# Patient Record
Sex: Male | Born: 1950 | Race: White | Hispanic: No | Marital: Single | Smoking: Never smoker
Health system: Southern US, Community
[De-identification: ages and names within clinical notes are randomized; demographics above are authoritative.]

## PROBLEM LIST (undated history)

## (undated) DIAGNOSIS — S42301A Unspecified fracture of shaft of humerus, right arm, initial encounter for closed fracture: Secondary | ICD-10-CM

## (undated) DIAGNOSIS — H04129 Dry eye syndrome of unspecified lacrimal gland: Secondary | ICD-10-CM

## (undated) DIAGNOSIS — M24529 Contracture, unspecified elbow: Secondary | ICD-10-CM

## (undated) DIAGNOSIS — R1310 Dysphagia, unspecified: Secondary | ICD-10-CM

## (undated) DIAGNOSIS — F32A Depression, unspecified: Secondary | ICD-10-CM

## (undated) DIAGNOSIS — N390 Urinary tract infection, site not specified: Secondary | ICD-10-CM

## (undated) DIAGNOSIS — G8191 Hemiplegia, unspecified affecting right dominant side: Secondary | ICD-10-CM

## (undated) DIAGNOSIS — R52 Pain, unspecified: Secondary | ICD-10-CM

## (undated) DIAGNOSIS — T402X5A Adverse effect of other opioids, initial encounter: Secondary | ICD-10-CM

## (undated) DIAGNOSIS — S069XAA Unspecified intracranial injury with loss of consciousness status unknown, initial encounter: Secondary | ICD-10-CM

## (undated) DIAGNOSIS — R569 Unspecified convulsions: Secondary | ICD-10-CM

## (undated) DIAGNOSIS — F0280 Dementia in other diseases classified elsewhere without behavioral disturbance: Secondary | ICD-10-CM

## (undated) DIAGNOSIS — F419 Anxiety disorder, unspecified: Secondary | ICD-10-CM

## (undated) DIAGNOSIS — F329 Major depressive disorder, single episode, unspecified: Secondary | ICD-10-CM

## (undated) DIAGNOSIS — E785 Hyperlipidemia, unspecified: Secondary | ICD-10-CM

## (undated) DIAGNOSIS — S069X9A Unspecified intracranial injury with loss of consciousness of unspecified duration, initial encounter: Secondary | ICD-10-CM

## (undated) DIAGNOSIS — K5900 Constipation, unspecified: Secondary | ICD-10-CM

## (undated) DIAGNOSIS — K5903 Drug induced constipation: Secondary | ICD-10-CM

## (undated) DIAGNOSIS — E46 Unspecified protein-calorie malnutrition: Secondary | ICD-10-CM

## (undated) DIAGNOSIS — F0150 Vascular dementia without behavioral disturbance: Secondary | ICD-10-CM

## (undated) HISTORY — DX: Dysphagia, unspecified: R13.10

## (undated) HISTORY — DX: Anxiety disorder, unspecified: F41.9

## (undated) HISTORY — DX: Urinary tract infection, site not specified: N39.0

## (undated) HISTORY — DX: Hyperlipidemia, unspecified: E78.5

## (undated) HISTORY — DX: Unspecified convulsions: R56.9

## (undated) HISTORY — DX: Pain, unspecified: R52

## (undated) HISTORY — DX: Adverse effect of other opioids, initial encounter: T40.2X5A

## (undated) HISTORY — DX: Major depressive disorder, single episode, unspecified: F32.9

## (undated) HISTORY — DX: Unspecified intracranial injury with loss of consciousness status unknown, initial encounter: S06.9XAA

## (undated) HISTORY — DX: Dementia in other diseases classified elsewhere, unspecified severity, without behavioral disturbance, psychotic disturbance, mood disturbance, and anxiety: F02.80

## (undated) HISTORY — DX: Adverse effect of other opioids, initial encounter: K59.03

## (undated) HISTORY — DX: Unspecified fracture of shaft of humerus, right arm, initial encounter for closed fracture: S42.301A

## (undated) HISTORY — DX: Unspecified protein-calorie malnutrition: E46

## (undated) HISTORY — DX: Unspecified intracranial injury with loss of consciousness of unspecified duration, initial encounter: S06.9X9A

## (undated) HISTORY — DX: Depression, unspecified: F32.A

---

## 1974-09-11 HISTORY — PX: OTHER SURGICAL HISTORY: SHX169

## 1987-06-08 HISTORY — PX: OTHER SURGICAL HISTORY: SHX169

## 1987-08-09 HISTORY — PX: NEPHRECTOMY: SHX65

## 1998-03-17 ENCOUNTER — Encounter: Admission: RE | Admit: 1998-03-17 | Discharge: 1998-06-15 | Payer: Self-pay | Admitting: Internal Medicine

## 2002-01-31 ENCOUNTER — Ambulatory Visit (HOSPITAL_BASED_OUTPATIENT_CLINIC_OR_DEPARTMENT_OTHER): Admission: RE | Admit: 2002-01-31 | Discharge: 2002-01-31 | Payer: Self-pay | Admitting: Surgery

## 2008-06-09 ENCOUNTER — Ambulatory Visit (HOSPITAL_COMMUNITY): Admission: RE | Admit: 2008-06-09 | Discharge: 2008-06-09 | Payer: Self-pay | Admitting: Internal Medicine

## 2008-07-16 ENCOUNTER — Encounter (INDEPENDENT_AMBULATORY_CARE_PROVIDER_SITE_OTHER): Payer: Self-pay | Admitting: General Surgery

## 2008-07-16 ENCOUNTER — Inpatient Hospital Stay (HOSPITAL_COMMUNITY): Admission: RE | Admit: 2008-07-16 | Discharge: 2008-07-20 | Payer: Self-pay | Admitting: General Surgery
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
## 2011-06-12 LAB — CREATININE, SERUM: GFR calc Af Amer: >60

## 2011-06-13 LAB — CULTURE, ROUTINE-ABSCESS

## 2011-09-14 DIAGNOSIS — F329 Major depressive disorder, single episode, unspecified: Secondary | ICD-10-CM | POA: Diagnosis not present

## 2011-09-14 DIAGNOSIS — F0150 Vascular dementia without behavioral disturbance: Secondary | ICD-10-CM | POA: Diagnosis not present

## 2011-09-14 DIAGNOSIS — K5900 Constipation, unspecified: Secondary | ICD-10-CM | POA: Diagnosis not present

## 2011-09-14 DIAGNOSIS — R569 Unspecified convulsions: Secondary | ICD-10-CM | POA: Diagnosis not present

## 2011-09-14 DIAGNOSIS — F411 Generalized anxiety disorder: Secondary | ICD-10-CM | POA: Diagnosis not present

## 2011-09-18 DIAGNOSIS — R569 Unspecified convulsions: Secondary | ICD-10-CM | POA: Diagnosis not present

## 2011-09-27 DIAGNOSIS — M79609 Pain in unspecified limb: Secondary | ICD-10-CM | POA: Diagnosis not present

## 2011-09-27 DIAGNOSIS — B351 Tinea unguium: Secondary | ICD-10-CM | POA: Diagnosis not present

## 2011-10-20 DIAGNOSIS — F411 Generalized anxiety disorder: Secondary | ICD-10-CM | POA: Diagnosis not present

## 2011-10-20 DIAGNOSIS — K5900 Constipation, unspecified: Secondary | ICD-10-CM | POA: Diagnosis not present

## 2011-10-20 DIAGNOSIS — F329 Major depressive disorder, single episode, unspecified: Secondary | ICD-10-CM | POA: Diagnosis not present

## 2011-10-20 DIAGNOSIS — R569 Unspecified convulsions: Secondary | ICD-10-CM | POA: Diagnosis not present

## 2011-11-04 DIAGNOSIS — N390 Urinary tract infection, site not specified: Secondary | ICD-10-CM | POA: Diagnosis not present

## 2011-11-21 DIAGNOSIS — R569 Unspecified convulsions: Secondary | ICD-10-CM | POA: Diagnosis not present

## 2011-11-21 DIAGNOSIS — K5900 Constipation, unspecified: Secondary | ICD-10-CM | POA: Diagnosis not present

## 2011-11-21 DIAGNOSIS — F329 Major depressive disorder, single episode, unspecified: Secondary | ICD-10-CM | POA: Diagnosis not present

## 2011-11-21 DIAGNOSIS — F411 Generalized anxiety disorder: Secondary | ICD-10-CM | POA: Diagnosis not present

## 2011-12-27 DIAGNOSIS — M79609 Pain in unspecified limb: Secondary | ICD-10-CM | POA: Diagnosis not present

## 2011-12-27 DIAGNOSIS — B351 Tinea unguium: Secondary | ICD-10-CM | POA: Diagnosis not present

## 2012-01-09 DIAGNOSIS — F0150 Vascular dementia without behavioral disturbance: Secondary | ICD-10-CM | POA: Diagnosis not present

## 2012-01-09 DIAGNOSIS — Z79899 Other long term (current) drug therapy: Secondary | ICD-10-CM | POA: Diagnosis not present

## 2012-01-09 DIAGNOSIS — F411 Generalized anxiety disorder: Secondary | ICD-10-CM | POA: Diagnosis not present

## 2012-01-09 DIAGNOSIS — F329 Major depressive disorder, single episode, unspecified: Secondary | ICD-10-CM | POA: Diagnosis not present

## 2012-01-09 DIAGNOSIS — R569 Unspecified convulsions: Secondary | ICD-10-CM | POA: Diagnosis not present

## 2012-01-09 DIAGNOSIS — K5900 Constipation, unspecified: Secondary | ICD-10-CM | POA: Diagnosis not present

## 2012-01-11 DIAGNOSIS — Z79899 Other long term (current) drug therapy: Secondary | ICD-10-CM | POA: Diagnosis not present

## 2012-01-11 DIAGNOSIS — G8100 Flaccid hemiplegia affecting unspecified side: Secondary | ICD-10-CM | POA: Diagnosis not present

## 2012-01-19 DIAGNOSIS — G8100 Flaccid hemiplegia affecting unspecified side: Secondary | ICD-10-CM | POA: Diagnosis not present

## 2012-01-19 DIAGNOSIS — Z5189 Encounter for other specified aftercare: Secondary | ICD-10-CM | POA: Diagnosis not present

## 2012-01-19 DIAGNOSIS — R1312 Dysphagia, oropharyngeal phase: Secondary | ICD-10-CM | POA: Diagnosis not present

## 2012-01-20 DIAGNOSIS — G8100 Flaccid hemiplegia affecting unspecified side: Secondary | ICD-10-CM | POA: Diagnosis not present

## 2012-01-20 DIAGNOSIS — R1312 Dysphagia, oropharyngeal phase: Secondary | ICD-10-CM | POA: Diagnosis not present

## 2012-01-20 DIAGNOSIS — Z5189 Encounter for other specified aftercare: Secondary | ICD-10-CM | POA: Diagnosis not present

## 2012-01-23 DIAGNOSIS — Z5189 Encounter for other specified aftercare: Secondary | ICD-10-CM | POA: Diagnosis not present

## 2012-01-23 DIAGNOSIS — G8100 Flaccid hemiplegia affecting unspecified side: Secondary | ICD-10-CM | POA: Diagnosis not present

## 2012-01-23 DIAGNOSIS — R1312 Dysphagia, oropharyngeal phase: Secondary | ICD-10-CM | POA: Diagnosis not present

## 2012-01-24 DIAGNOSIS — Z5189 Encounter for other specified aftercare: Secondary | ICD-10-CM | POA: Diagnosis not present

## 2012-01-24 DIAGNOSIS — R1312 Dysphagia, oropharyngeal phase: Secondary | ICD-10-CM | POA: Diagnosis not present

## 2012-01-24 DIAGNOSIS — G8100 Flaccid hemiplegia affecting unspecified side: Secondary | ICD-10-CM | POA: Diagnosis not present

## 2012-01-25 DIAGNOSIS — Z5189 Encounter for other specified aftercare: Secondary | ICD-10-CM | POA: Diagnosis not present

## 2012-01-25 DIAGNOSIS — R1312 Dysphagia, oropharyngeal phase: Secondary | ICD-10-CM | POA: Diagnosis not present

## 2012-01-25 DIAGNOSIS — G8100 Flaccid hemiplegia affecting unspecified side: Secondary | ICD-10-CM | POA: Diagnosis not present

## 2012-01-26 DIAGNOSIS — G8100 Flaccid hemiplegia affecting unspecified side: Secondary | ICD-10-CM | POA: Diagnosis not present

## 2012-01-26 DIAGNOSIS — Z5189 Encounter for other specified aftercare: Secondary | ICD-10-CM | POA: Diagnosis not present

## 2012-01-26 DIAGNOSIS — R1312 Dysphagia, oropharyngeal phase: Secondary | ICD-10-CM | POA: Diagnosis not present

## 2012-01-29 DIAGNOSIS — R1312 Dysphagia, oropharyngeal phase: Secondary | ICD-10-CM | POA: Diagnosis not present

## 2012-01-29 DIAGNOSIS — G8100 Flaccid hemiplegia affecting unspecified side: Secondary | ICD-10-CM | POA: Diagnosis not present

## 2012-01-29 DIAGNOSIS — Z5189 Encounter for other specified aftercare: Secondary | ICD-10-CM | POA: Diagnosis not present

## 2012-01-30 DIAGNOSIS — G8100 Flaccid hemiplegia affecting unspecified side: Secondary | ICD-10-CM | POA: Diagnosis not present

## 2012-01-30 DIAGNOSIS — Z5189 Encounter for other specified aftercare: Secondary | ICD-10-CM | POA: Diagnosis not present

## 2012-01-30 DIAGNOSIS — R1312 Dysphagia, oropharyngeal phase: Secondary | ICD-10-CM | POA: Diagnosis not present

## 2012-01-31 DIAGNOSIS — Z5189 Encounter for other specified aftercare: Secondary | ICD-10-CM | POA: Diagnosis not present

## 2012-01-31 DIAGNOSIS — R1312 Dysphagia, oropharyngeal phase: Secondary | ICD-10-CM | POA: Diagnosis not present

## 2012-01-31 DIAGNOSIS — G8100 Flaccid hemiplegia affecting unspecified side: Secondary | ICD-10-CM | POA: Diagnosis not present

## 2012-02-01 DIAGNOSIS — Z5189 Encounter for other specified aftercare: Secondary | ICD-10-CM | POA: Diagnosis not present

## 2012-02-01 DIAGNOSIS — R1312 Dysphagia, oropharyngeal phase: Secondary | ICD-10-CM | POA: Diagnosis not present

## 2012-02-01 DIAGNOSIS — G8100 Flaccid hemiplegia affecting unspecified side: Secondary | ICD-10-CM | POA: Diagnosis not present

## 2012-02-02 DIAGNOSIS — G8100 Flaccid hemiplegia affecting unspecified side: Secondary | ICD-10-CM | POA: Diagnosis not present

## 2012-02-02 DIAGNOSIS — Z5189 Encounter for other specified aftercare: Secondary | ICD-10-CM | POA: Diagnosis not present

## 2012-02-02 DIAGNOSIS — R1312 Dysphagia, oropharyngeal phase: Secondary | ICD-10-CM | POA: Diagnosis not present

## 2012-02-05 DIAGNOSIS — G8100 Flaccid hemiplegia affecting unspecified side: Secondary | ICD-10-CM | POA: Diagnosis not present

## 2012-02-05 DIAGNOSIS — R1312 Dysphagia, oropharyngeal phase: Secondary | ICD-10-CM | POA: Diagnosis not present

## 2012-02-05 DIAGNOSIS — Z5189 Encounter for other specified aftercare: Secondary | ICD-10-CM | POA: Diagnosis not present

## 2012-02-06 DIAGNOSIS — G8100 Flaccid hemiplegia affecting unspecified side: Secondary | ICD-10-CM | POA: Diagnosis not present

## 2012-02-06 DIAGNOSIS — Z5189 Encounter for other specified aftercare: Secondary | ICD-10-CM | POA: Diagnosis not present

## 2012-02-06 DIAGNOSIS — R1312 Dysphagia, oropharyngeal phase: Secondary | ICD-10-CM | POA: Diagnosis not present

## 2012-02-07 DIAGNOSIS — K5900 Constipation, unspecified: Secondary | ICD-10-CM | POA: Diagnosis not present

## 2012-02-07 DIAGNOSIS — F411 Generalized anxiety disorder: Secondary | ICD-10-CM | POA: Diagnosis not present

## 2012-02-07 DIAGNOSIS — F0150 Vascular dementia without behavioral disturbance: Secondary | ICD-10-CM | POA: Diagnosis not present

## 2012-02-07 DIAGNOSIS — Z5189 Encounter for other specified aftercare: Secondary | ICD-10-CM | POA: Diagnosis not present

## 2012-02-07 DIAGNOSIS — R569 Unspecified convulsions: Secondary | ICD-10-CM | POA: Diagnosis not present

## 2012-02-07 DIAGNOSIS — F329 Major depressive disorder, single episode, unspecified: Secondary | ICD-10-CM | POA: Diagnosis not present

## 2012-02-07 DIAGNOSIS — R1312 Dysphagia, oropharyngeal phase: Secondary | ICD-10-CM | POA: Diagnosis not present

## 2012-02-07 DIAGNOSIS — G8100 Flaccid hemiplegia affecting unspecified side: Secondary | ICD-10-CM | POA: Diagnosis not present

## 2012-02-08 DIAGNOSIS — R1312 Dysphagia, oropharyngeal phase: Secondary | ICD-10-CM | POA: Diagnosis not present

## 2012-02-08 DIAGNOSIS — G8100 Flaccid hemiplegia affecting unspecified side: Secondary | ICD-10-CM | POA: Diagnosis not present

## 2012-02-08 DIAGNOSIS — Z5189 Encounter for other specified aftercare: Secondary | ICD-10-CM | POA: Diagnosis not present

## 2012-02-09 DIAGNOSIS — R1312 Dysphagia, oropharyngeal phase: Secondary | ICD-10-CM | POA: Diagnosis not present

## 2012-02-09 DIAGNOSIS — G8100 Flaccid hemiplegia affecting unspecified side: Secondary | ICD-10-CM | POA: Diagnosis not present

## 2012-02-09 DIAGNOSIS — Z5189 Encounter for other specified aftercare: Secondary | ICD-10-CM | POA: Diagnosis not present

## 2012-02-12 DIAGNOSIS — Z79899 Other long term (current) drug therapy: Secondary | ICD-10-CM | POA: Diagnosis not present

## 2012-02-28 DIAGNOSIS — K5900 Constipation, unspecified: Secondary | ICD-10-CM | POA: Diagnosis not present

## 2012-02-28 DIAGNOSIS — R569 Unspecified convulsions: Secondary | ICD-10-CM | POA: Diagnosis not present

## 2012-02-28 DIAGNOSIS — F411 Generalized anxiety disorder: Secondary | ICD-10-CM | POA: Diagnosis not present

## 2012-02-28 DIAGNOSIS — F0150 Vascular dementia without behavioral disturbance: Secondary | ICD-10-CM | POA: Diagnosis not present

## 2012-02-28 DIAGNOSIS — F329 Major depressive disorder, single episode, unspecified: Secondary | ICD-10-CM | POA: Diagnosis not present

## 2012-03-19 DIAGNOSIS — R569 Unspecified convulsions: Secondary | ICD-10-CM | POA: Diagnosis not present

## 2012-03-19 DIAGNOSIS — F411 Generalized anxiety disorder: Secondary | ICD-10-CM | POA: Diagnosis not present

## 2012-03-19 DIAGNOSIS — F329 Major depressive disorder, single episode, unspecified: Secondary | ICD-10-CM | POA: Diagnosis not present

## 2012-03-19 DIAGNOSIS — F0150 Vascular dementia without behavioral disturbance: Secondary | ICD-10-CM | POA: Diagnosis not present

## 2012-03-19 DIAGNOSIS — K5900 Constipation, unspecified: Secondary | ICD-10-CM | POA: Diagnosis not present

## 2012-04-24 DIAGNOSIS — M24549 Contracture, unspecified hand: Secondary | ICD-10-CM | POA: Diagnosis not present

## 2012-04-24 DIAGNOSIS — G8100 Flaccid hemiplegia affecting unspecified side: Secondary | ICD-10-CM | POA: Diagnosis not present

## 2012-04-24 DIAGNOSIS — Z5189 Encounter for other specified aftercare: Secondary | ICD-10-CM | POA: Diagnosis not present

## 2012-04-24 DIAGNOSIS — M24539 Contracture, unspecified wrist: Secondary | ICD-10-CM | POA: Diagnosis not present

## 2012-04-25 DIAGNOSIS — M24549 Contracture, unspecified hand: Secondary | ICD-10-CM | POA: Diagnosis not present

## 2012-04-25 DIAGNOSIS — F329 Major depressive disorder, single episode, unspecified: Secondary | ICD-10-CM | POA: Diagnosis not present

## 2012-04-25 DIAGNOSIS — G8100 Flaccid hemiplegia affecting unspecified side: Secondary | ICD-10-CM | POA: Diagnosis not present

## 2012-04-25 DIAGNOSIS — Z5189 Encounter for other specified aftercare: Secondary | ICD-10-CM | POA: Diagnosis not present

## 2012-04-25 DIAGNOSIS — F0150 Vascular dementia without behavioral disturbance: Secondary | ICD-10-CM | POA: Diagnosis not present

## 2012-04-25 DIAGNOSIS — M24539 Contracture, unspecified wrist: Secondary | ICD-10-CM | POA: Diagnosis not present

## 2012-04-25 DIAGNOSIS — L02429 Furuncle of limb, unspecified: Secondary | ICD-10-CM | POA: Diagnosis not present

## 2012-04-26 DIAGNOSIS — M24549 Contracture, unspecified hand: Secondary | ICD-10-CM | POA: Diagnosis not present

## 2012-04-26 DIAGNOSIS — M24539 Contracture, unspecified wrist: Secondary | ICD-10-CM | POA: Diagnosis not present

## 2012-04-26 DIAGNOSIS — Z5189 Encounter for other specified aftercare: Secondary | ICD-10-CM | POA: Diagnosis not present

## 2012-04-26 DIAGNOSIS — G8100 Flaccid hemiplegia affecting unspecified side: Secondary | ICD-10-CM | POA: Diagnosis not present

## 2012-04-26 DIAGNOSIS — N390 Urinary tract infection, site not specified: Secondary | ICD-10-CM | POA: Diagnosis not present

## 2012-04-29 DIAGNOSIS — Z5189 Encounter for other specified aftercare: Secondary | ICD-10-CM | POA: Diagnosis not present

## 2012-04-29 DIAGNOSIS — M24549 Contracture, unspecified hand: Secondary | ICD-10-CM | POA: Diagnosis not present

## 2012-04-29 DIAGNOSIS — G8100 Flaccid hemiplegia affecting unspecified side: Secondary | ICD-10-CM | POA: Diagnosis not present

## 2012-04-29 DIAGNOSIS — M24539 Contracture, unspecified wrist: Secondary | ICD-10-CM | POA: Diagnosis not present

## 2012-04-30 DIAGNOSIS — G8100 Flaccid hemiplegia affecting unspecified side: Secondary | ICD-10-CM | POA: Diagnosis not present

## 2012-04-30 DIAGNOSIS — M24549 Contracture, unspecified hand: Secondary | ICD-10-CM | POA: Diagnosis not present

## 2012-04-30 DIAGNOSIS — Z5189 Encounter for other specified aftercare: Secondary | ICD-10-CM | POA: Diagnosis not present

## 2012-04-30 DIAGNOSIS — M24539 Contracture, unspecified wrist: Secondary | ICD-10-CM | POA: Diagnosis not present

## 2012-05-01 DIAGNOSIS — M24539 Contracture, unspecified wrist: Secondary | ICD-10-CM | POA: Diagnosis not present

## 2012-05-01 DIAGNOSIS — Z5189 Encounter for other specified aftercare: Secondary | ICD-10-CM | POA: Diagnosis not present

## 2012-05-01 DIAGNOSIS — G8100 Flaccid hemiplegia affecting unspecified side: Secondary | ICD-10-CM | POA: Diagnosis not present

## 2012-05-01 DIAGNOSIS — F329 Major depressive disorder, single episode, unspecified: Secondary | ICD-10-CM | POA: Diagnosis not present

## 2012-05-01 DIAGNOSIS — M24549 Contracture, unspecified hand: Secondary | ICD-10-CM | POA: Diagnosis not present

## 2012-05-01 DIAGNOSIS — K5900 Constipation, unspecified: Secondary | ICD-10-CM | POA: Diagnosis not present

## 2012-05-01 DIAGNOSIS — F411 Generalized anxiety disorder: Secondary | ICD-10-CM | POA: Diagnosis not present

## 2012-05-01 DIAGNOSIS — L0292 Furuncle, unspecified: Secondary | ICD-10-CM | POA: Diagnosis not present

## 2012-05-01 DIAGNOSIS — R569 Unspecified convulsions: Secondary | ICD-10-CM | POA: Diagnosis not present

## 2012-05-01 DIAGNOSIS — F0150 Vascular dementia without behavioral disturbance: Secondary | ICD-10-CM | POA: Diagnosis not present

## 2012-05-03 DIAGNOSIS — M24539 Contracture, unspecified wrist: Secondary | ICD-10-CM | POA: Diagnosis not present

## 2012-05-03 DIAGNOSIS — G8100 Flaccid hemiplegia affecting unspecified side: Secondary | ICD-10-CM | POA: Diagnosis not present

## 2012-05-03 DIAGNOSIS — M24549 Contracture, unspecified hand: Secondary | ICD-10-CM | POA: Diagnosis not present

## 2012-05-03 DIAGNOSIS — Z5189 Encounter for other specified aftercare: Secondary | ICD-10-CM | POA: Diagnosis not present

## 2012-05-04 DIAGNOSIS — G8100 Flaccid hemiplegia affecting unspecified side: Secondary | ICD-10-CM | POA: Diagnosis not present

## 2012-05-04 DIAGNOSIS — M24539 Contracture, unspecified wrist: Secondary | ICD-10-CM | POA: Diagnosis not present

## 2012-05-04 DIAGNOSIS — M24549 Contracture, unspecified hand: Secondary | ICD-10-CM | POA: Diagnosis not present

## 2012-05-04 DIAGNOSIS — Z5189 Encounter for other specified aftercare: Secondary | ICD-10-CM | POA: Diagnosis not present

## 2012-05-06 DIAGNOSIS — M24549 Contracture, unspecified hand: Secondary | ICD-10-CM | POA: Diagnosis not present

## 2012-05-06 DIAGNOSIS — M24539 Contracture, unspecified wrist: Secondary | ICD-10-CM | POA: Diagnosis not present

## 2012-05-06 DIAGNOSIS — Z5189 Encounter for other specified aftercare: Secondary | ICD-10-CM | POA: Diagnosis not present

## 2012-05-06 DIAGNOSIS — L8990 Pressure ulcer of unspecified site, unspecified stage: Secondary | ICD-10-CM | POA: Diagnosis not present

## 2012-05-06 DIAGNOSIS — G8100 Flaccid hemiplegia affecting unspecified side: Secondary | ICD-10-CM | POA: Diagnosis not present

## 2012-05-07 DIAGNOSIS — M24549 Contracture, unspecified hand: Secondary | ICD-10-CM | POA: Diagnosis not present

## 2012-05-07 DIAGNOSIS — M24539 Contracture, unspecified wrist: Secondary | ICD-10-CM | POA: Diagnosis not present

## 2012-05-07 DIAGNOSIS — G8100 Flaccid hemiplegia affecting unspecified side: Secondary | ICD-10-CM | POA: Diagnosis not present

## 2012-05-07 DIAGNOSIS — Z5189 Encounter for other specified aftercare: Secondary | ICD-10-CM | POA: Diagnosis not present

## 2012-05-08 DIAGNOSIS — M24539 Contracture, unspecified wrist: Secondary | ICD-10-CM | POA: Diagnosis not present

## 2012-05-08 DIAGNOSIS — Z5189 Encounter for other specified aftercare: Secondary | ICD-10-CM | POA: Diagnosis not present

## 2012-05-08 DIAGNOSIS — G8100 Flaccid hemiplegia affecting unspecified side: Secondary | ICD-10-CM | POA: Diagnosis not present

## 2012-05-08 DIAGNOSIS — M24549 Contracture, unspecified hand: Secondary | ICD-10-CM | POA: Diagnosis not present

## 2012-05-09 DIAGNOSIS — M24549 Contracture, unspecified hand: Secondary | ICD-10-CM | POA: Diagnosis not present

## 2012-05-09 DIAGNOSIS — Z5189 Encounter for other specified aftercare: Secondary | ICD-10-CM | POA: Diagnosis not present

## 2012-05-09 DIAGNOSIS — G8100 Flaccid hemiplegia affecting unspecified side: Secondary | ICD-10-CM | POA: Diagnosis not present

## 2012-05-09 DIAGNOSIS — M24539 Contracture, unspecified wrist: Secondary | ICD-10-CM | POA: Diagnosis not present

## 2012-05-10 DIAGNOSIS — Z5189 Encounter for other specified aftercare: Secondary | ICD-10-CM | POA: Diagnosis not present

## 2012-05-10 DIAGNOSIS — M24539 Contracture, unspecified wrist: Secondary | ICD-10-CM | POA: Diagnosis not present

## 2012-05-10 DIAGNOSIS — M24549 Contracture, unspecified hand: Secondary | ICD-10-CM | POA: Diagnosis not present

## 2012-05-10 DIAGNOSIS — G8100 Flaccid hemiplegia affecting unspecified side: Secondary | ICD-10-CM | POA: Diagnosis not present

## 2012-05-16 DIAGNOSIS — Z125 Encounter for screening for malignant neoplasm of prostate: Secondary | ICD-10-CM | POA: Diagnosis not present

## 2012-05-20 DIAGNOSIS — Z125 Encounter for screening for malignant neoplasm of prostate: Secondary | ICD-10-CM | POA: Diagnosis not present

## 2012-05-31 DIAGNOSIS — F329 Major depressive disorder, single episode, unspecified: Secondary | ICD-10-CM | POA: Diagnosis not present

## 2012-05-31 DIAGNOSIS — F411 Generalized anxiety disorder: Secondary | ICD-10-CM | POA: Diagnosis not present

## 2012-05-31 DIAGNOSIS — F0150 Vascular dementia without behavioral disturbance: Secondary | ICD-10-CM | POA: Diagnosis not present

## 2012-05-31 DIAGNOSIS — R569 Unspecified convulsions: Secondary | ICD-10-CM | POA: Diagnosis not present

## 2012-05-31 DIAGNOSIS — K5900 Constipation, unspecified: Secondary | ICD-10-CM | POA: Diagnosis not present

## 2012-06-20 DIAGNOSIS — H6690 Otitis media, unspecified, unspecified ear: Secondary | ICD-10-CM | POA: Diagnosis not present

## 2012-06-20 DIAGNOSIS — F411 Generalized anxiety disorder: Secondary | ICD-10-CM | POA: Diagnosis not present

## 2012-06-20 DIAGNOSIS — R569 Unspecified convulsions: Secondary | ICD-10-CM | POA: Diagnosis not present

## 2012-06-21 DIAGNOSIS — K5900 Constipation, unspecified: Secondary | ICD-10-CM | POA: Diagnosis not present

## 2012-06-21 DIAGNOSIS — F0281 Dementia in other diseases classified elsewhere with behavioral disturbance: Secondary | ICD-10-CM | POA: Diagnosis not present

## 2012-06-21 DIAGNOSIS — R569 Unspecified convulsions: Secondary | ICD-10-CM | POA: Diagnosis not present

## 2012-06-21 DIAGNOSIS — F411 Generalized anxiety disorder: Secondary | ICD-10-CM | POA: Diagnosis not present

## 2012-06-21 DIAGNOSIS — F329 Major depressive disorder, single episode, unspecified: Secondary | ICD-10-CM | POA: Diagnosis not present

## 2012-06-26 DIAGNOSIS — M79609 Pain in unspecified limb: Secondary | ICD-10-CM | POA: Diagnosis not present

## 2012-06-26 DIAGNOSIS — B351 Tinea unguium: Secondary | ICD-10-CM | POA: Diagnosis not present

## 2012-07-29 DIAGNOSIS — K5900 Constipation, unspecified: Secondary | ICD-10-CM | POA: Diagnosis not present

## 2012-07-29 DIAGNOSIS — F0281 Dementia in other diseases classified elsewhere with behavioral disturbance: Secondary | ICD-10-CM | POA: Diagnosis not present

## 2012-07-29 DIAGNOSIS — R569 Unspecified convulsions: Secondary | ICD-10-CM | POA: Diagnosis not present

## 2012-07-29 DIAGNOSIS — F329 Major depressive disorder, single episode, unspecified: Secondary | ICD-10-CM | POA: Diagnosis not present

## 2012-07-29 DIAGNOSIS — F411 Generalized anxiety disorder: Secondary | ICD-10-CM | POA: Diagnosis not present

## 2012-08-12 DIAGNOSIS — R569 Unspecified convulsions: Secondary | ICD-10-CM | POA: Diagnosis not present

## 2012-08-12 DIAGNOSIS — L0292 Furuncle, unspecified: Secondary | ICD-10-CM | POA: Diagnosis not present

## 2012-08-12 DIAGNOSIS — L0293 Carbuncle, unspecified: Secondary | ICD-10-CM | POA: Diagnosis not present

## 2012-08-12 DIAGNOSIS — F0150 Vascular dementia without behavioral disturbance: Secondary | ICD-10-CM | POA: Diagnosis not present

## 2012-08-21 DIAGNOSIS — Z79899 Other long term (current) drug therapy: Secondary | ICD-10-CM | POA: Diagnosis not present

## 2012-08-21 DIAGNOSIS — F0150 Vascular dementia without behavioral disturbance: Secondary | ICD-10-CM | POA: Diagnosis not present

## 2012-08-21 DIAGNOSIS — R569 Unspecified convulsions: Secondary | ICD-10-CM | POA: Diagnosis not present

## 2012-08-21 DIAGNOSIS — K5900 Constipation, unspecified: Secondary | ICD-10-CM | POA: Diagnosis not present

## 2012-08-21 DIAGNOSIS — F411 Generalized anxiety disorder: Secondary | ICD-10-CM | POA: Diagnosis not present

## 2012-08-21 DIAGNOSIS — F329 Major depressive disorder, single episode, unspecified: Secondary | ICD-10-CM | POA: Diagnosis not present

## 2012-08-22 DIAGNOSIS — D649 Anemia, unspecified: Secondary | ICD-10-CM | POA: Diagnosis not present

## 2012-08-22 DIAGNOSIS — R569 Unspecified convulsions: Secondary | ICD-10-CM | POA: Diagnosis not present

## 2012-08-22 DIAGNOSIS — E119 Type 2 diabetes mellitus without complications: Secondary | ICD-10-CM | POA: Diagnosis not present

## 2012-08-28 DIAGNOSIS — F411 Generalized anxiety disorder: Secondary | ICD-10-CM | POA: Diagnosis not present

## 2012-08-28 DIAGNOSIS — L0292 Furuncle, unspecified: Secondary | ICD-10-CM | POA: Diagnosis not present

## 2012-08-28 DIAGNOSIS — R569 Unspecified convulsions: Secondary | ICD-10-CM | POA: Diagnosis not present

## 2012-08-28 DIAGNOSIS — L0293 Carbuncle, unspecified: Secondary | ICD-10-CM | POA: Diagnosis not present

## 2012-08-29 DIAGNOSIS — L89109 Pressure ulcer of unspecified part of back, unspecified stage: Secondary | ICD-10-CM | POA: Diagnosis not present

## 2012-09-26 DIAGNOSIS — R569 Unspecified convulsions: Secondary | ICD-10-CM | POA: Diagnosis not present

## 2012-09-26 DIAGNOSIS — F411 Generalized anxiety disorder: Secondary | ICD-10-CM | POA: Diagnosis not present

## 2012-09-26 DIAGNOSIS — F0281 Dementia in other diseases classified elsewhere with behavioral disturbance: Secondary | ICD-10-CM | POA: Diagnosis not present

## 2012-09-26 DIAGNOSIS — L89109 Pressure ulcer of unspecified part of back, unspecified stage: Secondary | ICD-10-CM | POA: Diagnosis not present

## 2012-10-04 DIAGNOSIS — F329 Major depressive disorder, single episode, unspecified: Secondary | ICD-10-CM | POA: Diagnosis not present

## 2012-10-04 DIAGNOSIS — F0150 Vascular dementia without behavioral disturbance: Secondary | ICD-10-CM | POA: Diagnosis not present

## 2012-10-04 DIAGNOSIS — K5900 Constipation, unspecified: Secondary | ICD-10-CM | POA: Diagnosis not present

## 2012-10-04 DIAGNOSIS — R569 Unspecified convulsions: Secondary | ICD-10-CM | POA: Diagnosis not present

## 2012-10-04 DIAGNOSIS — F411 Generalized anxiety disorder: Secondary | ICD-10-CM | POA: Diagnosis not present

## 2012-10-28 DIAGNOSIS — F0150 Vascular dementia without behavioral disturbance: Secondary | ICD-10-CM | POA: Diagnosis not present

## 2012-10-28 DIAGNOSIS — F411 Generalized anxiety disorder: Secondary | ICD-10-CM | POA: Diagnosis not present

## 2012-10-28 DIAGNOSIS — F329 Major depressive disorder, single episode, unspecified: Secondary | ICD-10-CM | POA: Diagnosis not present

## 2012-10-28 DIAGNOSIS — K5900 Constipation, unspecified: Secondary | ICD-10-CM | POA: Diagnosis not present

## 2012-10-28 DIAGNOSIS — R569 Unspecified convulsions: Secondary | ICD-10-CM | POA: Diagnosis not present

## 2012-11-06 DIAGNOSIS — M79609 Pain in unspecified limb: Secondary | ICD-10-CM | POA: Diagnosis not present

## 2012-11-06 DIAGNOSIS — B351 Tinea unguium: Secondary | ICD-10-CM | POA: Diagnosis not present

## 2012-12-06 ENCOUNTER — Non-Acute Institutional Stay (SKILLED_NURSING_FACILITY): Payer: Medicare Other | Admitting: Adult Health

## 2012-12-06 DIAGNOSIS — R569 Unspecified convulsions: Secondary | ICD-10-CM | POA: Diagnosis not present

## 2012-12-06 DIAGNOSIS — K5900 Constipation, unspecified: Secondary | ICD-10-CM

## 2012-12-06 DIAGNOSIS — H6690 Otitis media, unspecified, unspecified ear: Secondary | ICD-10-CM

## 2012-12-06 DIAGNOSIS — H6692 Otitis media, unspecified, left ear: Secondary | ICD-10-CM

## 2012-12-06 DIAGNOSIS — R52 Pain, unspecified: Secondary | ICD-10-CM

## 2012-12-06 DIAGNOSIS — F329 Major depressive disorder, single episode, unspecified: Secondary | ICD-10-CM | POA: Diagnosis not present

## 2012-12-06 DIAGNOSIS — F411 Generalized anxiety disorder: Secondary | ICD-10-CM | POA: Diagnosis not present

## 2012-12-09 ENCOUNTER — Encounter: Payer: Self-pay | Admitting: Adult Health

## 2012-12-09 DIAGNOSIS — R569 Unspecified convulsions: Secondary | ICD-10-CM | POA: Insufficient documentation

## 2012-12-09 DIAGNOSIS — F329 Major depressive disorder, single episode, unspecified: Secondary | ICD-10-CM | POA: Insufficient documentation

## 2012-12-09 DIAGNOSIS — F419 Anxiety disorder, unspecified: Secondary | ICD-10-CM | POA: Insufficient documentation

## 2012-12-09 DIAGNOSIS — K5900 Constipation, unspecified: Secondary | ICD-10-CM | POA: Insufficient documentation

## 2012-12-09 DIAGNOSIS — R52 Pain, unspecified: Secondary | ICD-10-CM | POA: Insufficient documentation

## 2012-12-09 DIAGNOSIS — H6690 Otitis media, unspecified, unspecified ear: Secondary | ICD-10-CM | POA: Insufficient documentation

## 2012-12-09 NOTE — Progress Notes (Signed)
  Subjective:    Patient ID: Joshua Munoz, male    DOB: June 29, 1951, 62 y.o.   MRN: [id number]  HPI This is a 62 year old Caucasian male who is being seen on an annual visit. Patient noted to have yellowish malodorous drainage on his left ear. No fever reported.   Review of Systems  Constitutional: Negative for fever, chills and appetite change.  HENT: Positive for ear discharge. Negative for hearing loss, ear pain and postnasal drip.   Eyes: Negative for discharge.  Respiratory: Negative for chest tightness and shortness of breath.   Cardiovascular: Negative for chest pain and leg swelling.  Gastrointestinal: Negative for abdominal pain and abdominal distention.  Endocrine: Negative.   Neurological: Negative for seizures and headaches.  Hematological: Negative for adenopathy. Does not bruise/bleed easily.  Psychiatric/Behavioral: Negative for behavioral problems.       Objective:   Physical Exam  Constitutional: He appears well-nourished.  HENT:  Head: Normocephalic.  Right Ear: External ear normal.  Nose: Nose normal.  Yellowish drainage on left ear which has a strong smell  Eyes: Conjunctivae are normal. Pupils are equal, round, and reactive to light.  Neck: Normal range of motion. Neck supple.  Cardiovascular: Normal rate, regular rhythm and normal heart sounds.   Pulmonary/Chest: Effort normal and breath sounds normal.  Abdominal: Soft. Bowel sounds are normal.  Musculoskeletal: He exhibits no edema and no tenderness.  Neurological: He is alert.  Skin: Skin is warm and dry.  Psychiatric: He has a normal mood and affect. His behavior is normal.  LABS: 12/13  Wbc 4.6  hgb 13.6  hct 39.4  NA 133  K 4.3  BUN 11  Creatinine 0.66  Total protein 5.4   Albumin 3.3  Calcium 8.0  Tegretol 7.3  Phenobarbital 18.4        Assessment & Plan:  Depression -stable.  Anxiety state, unspecified - stable  Seizures - no seizure reported  Unspecified constipation - no  complaints  Pain, generalized - no complaints of pain  Otitis media, Left  - start Augmentin 500 mg PO Q 12 hours x 10 days

## 2012-12-24 ENCOUNTER — Other Ambulatory Visit: Payer: Self-pay | Admitting: Geriatric Medicine

## 2012-12-24 MED ORDER — PHENOBARBITAL 97.2 MG PO TABS: 97.2000 mg | ORAL_TABLET | Freq: Every day | ORAL | Status: DC

## 2013-01-07 DIAGNOSIS — H04129 Dry eye syndrome of unspecified lacrimal gland: Secondary | ICD-10-CM | POA: Diagnosis not present

## 2013-01-07 DIAGNOSIS — H47219 Primary optic atrophy, unspecified eye: Secondary | ICD-10-CM | POA: Diagnosis not present

## 2013-01-07 DIAGNOSIS — H2510 Age-related nuclear cataract, unspecified eye: Secondary | ICD-10-CM | POA: Diagnosis not present

## 2013-01-22 ENCOUNTER — Other Ambulatory Visit: Payer: Self-pay | Admitting: Geriatric Medicine

## 2013-01-22 MED ORDER — PHENOBARBITAL 97.2 MG PO TABS: 97.2000 mg | ORAL_TABLET | Freq: Every day | ORAL | Status: DC

## 2013-01-29 ENCOUNTER — Non-Acute Institutional Stay (SKILLED_NURSING_FACILITY): Payer: Medicare Other | Admitting: Internal Medicine

## 2013-01-29 DIAGNOSIS — R569 Unspecified convulsions: Secondary | ICD-10-CM | POA: Diagnosis not present

## 2013-01-29 DIAGNOSIS — K5900 Constipation, unspecified: Secondary | ICD-10-CM

## 2013-01-29 DIAGNOSIS — F02818 Dementia in other diseases classified elsewhere, unspecified severity, with other behavioral disturbance: Secondary | ICD-10-CM

## 2013-01-29 DIAGNOSIS — R52 Pain, unspecified: Secondary | ICD-10-CM | POA: Diagnosis not present

## 2013-01-29 DIAGNOSIS — F0281 Dementia in other diseases classified elsewhere with behavioral disturbance: Secondary | ICD-10-CM | POA: Diagnosis not present

## 2013-02-02 DIAGNOSIS — F0281 Dementia in other diseases classified elsewhere with behavioral disturbance: Secondary | ICD-10-CM | POA: Insufficient documentation

## 2013-02-02 NOTE — Progress Notes (Signed)
PROGRESS NOTE  DATE: 01/29/2013  FACILITY: Nursing Home Location: [facility]  LEVEL OF CARE: SNF (31)  Routine Visit  CHIEF COMPLAINT:  Manage seizure disorder, constipation and dementia  HISTORY OF PRESENT ILLNESS:  REASSESSMENT OF ONGOING PROBLEM(S):  1. SEIZURE DISORDER: The patient's seizure disorder remains stable. No complications reported from the medications presently being used. Staff do not report any recent seizure activity. Patient is a poor historian.  2. CONSTIPATION: The constipation remains stable. No complications from the medications presently being used. Staff deny ongoing constipation, abdominal pain, nausea or vomiting.  3. DEMENTIA: The dementia remaines stable and continues to function adequately in the current living environment with supervision.  The patient has had little changes in behavior. No complications noted from the medications presently being used. Patient does not follow commands.  PAST MEDICAL HISTORY : Reviewed.  No changes.  CURRENT MEDICATIONS: Reviewed per [facility]  REVIEW OF SYSTEMS: Unobtainable secondary to cerebral palsy and dementia  PHYSICAL EXAMINATION  VS:  T 99.1      P 75      RR 18      BP 111/61     POX %     WT (Lb)  GENERAL: no acute distress, normal body habitus EYES: conjunctivae normal, sclerae normal, normal eye lids NECK: supple, trachea midline, no neck masses, no thyroid tenderness, no thyromegaly LYMPHATICS: no LAN in the neck, no supraclavicular LAN RESPIRATORY: breathing is even & unlabored, BS CTAB CARDIAC: RRR, no murmur,no extra heart sounds, no edema GI: abdomen soft, normal BS, no masses, no tenderness, no hepatomegaly, no splenomegaly PSYCHIATRIC: the patient is alert & disoriented and, affect & behavior appropriate  LABS/RADIOLOGY:  12/13 CBC normal, total protein 5.4, albumin 3.3 otherwise CMP normal, hemoglobin A1c 5.4, Tegretol level 7.3, phenobarbital level  18.4  ASSESSMENT/PLAN:  1. seizure disorder-well-controlled. 2. constipation-well-controlled. 3. dementia-advanced. 4. chronic pain-patient appears comfortable. 5. anxiety-stable. 6. depression- no active issues.  CPT CODE: 25956

## 2013-02-26 DIAGNOSIS — M79609 Pain in unspecified limb: Secondary | ICD-10-CM | POA: Diagnosis not present

## 2013-02-26 DIAGNOSIS — B351 Tinea unguium: Secondary | ICD-10-CM | POA: Diagnosis not present

## 2013-02-28 DIAGNOSIS — Z79899 Other long term (current) drug therapy: Secondary | ICD-10-CM | POA: Diagnosis not present

## 2013-02-28 DIAGNOSIS — G92 Toxic encephalopathy: Secondary | ICD-10-CM | POA: Diagnosis not present

## 2013-02-28 DIAGNOSIS — D649 Anemia, unspecified: Secondary | ICD-10-CM | POA: Diagnosis not present

## 2013-02-28 DIAGNOSIS — G929 Unspecified toxic encephalopathy: Secondary | ICD-10-CM | POA: Diagnosis not present

## 2013-04-02 DIAGNOSIS — G8100 Flaccid hemiplegia affecting unspecified side: Secondary | ICD-10-CM | POA: Diagnosis not present

## 2013-04-02 DIAGNOSIS — M24549 Contracture, unspecified hand: Secondary | ICD-10-CM | POA: Diagnosis not present

## 2013-04-07 ENCOUNTER — Non-Acute Institutional Stay (SKILLED_NURSING_FACILITY): Payer: Medicare Other | Admitting: Adult Health

## 2013-04-07 DIAGNOSIS — F329 Major depressive disorder, single episode, unspecified: Secondary | ICD-10-CM | POA: Diagnosis not present

## 2013-04-07 DIAGNOSIS — F02818 Dementia in other diseases classified elsewhere, unspecified severity, with other behavioral disturbance: Secondary | ICD-10-CM

## 2013-04-07 DIAGNOSIS — F411 Generalized anxiety disorder: Secondary | ICD-10-CM | POA: Diagnosis not present

## 2013-04-07 DIAGNOSIS — R569 Unspecified convulsions: Secondary | ICD-10-CM

## 2013-04-07 DIAGNOSIS — F0281 Dementia in other diseases classified elsewhere with behavioral disturbance: Secondary | ICD-10-CM

## 2013-04-07 DIAGNOSIS — K5900 Constipation, unspecified: Secondary | ICD-10-CM

## 2013-04-07 DIAGNOSIS — R52 Pain, unspecified: Secondary | ICD-10-CM

## 2013-04-07 DIAGNOSIS — F3289 Other specified depressive episodes: Secondary | ICD-10-CM

## 2013-04-07 NOTE — Progress Notes (Signed)
Patient ID: Joshua Munoz, male   DOB: [birth year], 62 y.o.   MRN: [id number]       PROGRESS NOTE  DATE: 04/07/2013  FACILITY: Nursing Home Location: [facility] Rm : [phone]  LEVEL OF CARE: SNF (31)  Routine Visit  CHIEF COMPLAINT:  Manage anxiety, seizure, depression and constipation  HISTORY OF PRESENT ILLNESS:  REASSESSMENT OF ONGOING PROBLEM(S):  SEIZURE DISORDER: The patient's seizure disorder remains stable. No complications reported from the medications presently being used. Staff do not report any recent seizure activity.  DEPRESSION: The depression remains stable. Patient denies ongoing feelings of sadness, insomnia, anedhonia or lack of appetite. No complications reported from the medications currently being used. Staff do not report behavioral problems.    PAST MEDICAL HISTORY : Reviewed.  No changes.   CURRENT MEDICATIONS: Reviewed per [facility]  REVIEW OF SYSTEMS:  GENERAL: no change in appetite, no fatigue, no weight changes, no fever, chills or weakness RESPIRATORY: no cough, SOB, DOE, wheezing, hemoptysis CARDIAC: no chest pain, edema or palpitations GI: no abdominal pain, diarrhea, constipation, heart burn, nausea or vomiting  PHYSICAL EXAMINATION  VS:  T 98.1       P 74      RR 20      BP 120/70        WT 208.3 (Lb)  GENERAL: no acute distress, normal body habitus EYES: conjunctivae normal, sclerae normal, normal eye lids NECK: supple, trachea midline, no neck masses, no thyroid tenderness, no thyromegaly LYMPHATICS: no LAN in the neck, no supraclavicular LAN RESPIRATORY: breathing is even & unlabored, BS CTAB CARDIAC: RRR, no murmur,no extra heart sounds, no edema GI: abdomen soft, normal BS, no masses, no tenderness, no hepatomegaly, no splenomegaly PSYCHIATRIC: the patient is alert & oriented to person, affect & behavior appropriate  LABS/RADIOLOGY: 02/28/13 WBC 6.1 hemoglobin 13.6 hematocrit 38.7 liver profile normal phenobarbital 16.2   pre-albumin 24.5 12/13 CBC normal, total protein 5.4, albumin 3.3 otherwise CMP normal, hemoglobin A1c 5.4, Tegretol level 7.3, phenobarbital level 18.4   ASSESSMENT/PLAN:  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   CPT CODE: 56213

## 2013-04-14 DIAGNOSIS — M24549 Contracture, unspecified hand: Secondary | ICD-10-CM | POA: Diagnosis not present

## 2013-04-15 DIAGNOSIS — M24549 Contracture, unspecified hand: Secondary | ICD-10-CM | POA: Diagnosis not present

## 2013-04-16 DIAGNOSIS — M24549 Contracture, unspecified hand: Secondary | ICD-10-CM | POA: Diagnosis not present

## 2013-04-17 DIAGNOSIS — M24549 Contracture, unspecified hand: Secondary | ICD-10-CM | POA: Diagnosis not present

## 2013-04-18 DIAGNOSIS — M24549 Contracture, unspecified hand: Secondary | ICD-10-CM | POA: Diagnosis not present

## 2013-04-21 DIAGNOSIS — M24549 Contracture, unspecified hand: Secondary | ICD-10-CM | POA: Diagnosis not present

## 2013-05-08 ENCOUNTER — Non-Acute Institutional Stay (SKILLED_NURSING_FACILITY): Payer: Medicare Other | Admitting: Internal Medicine

## 2013-05-08 DIAGNOSIS — R569 Unspecified convulsions: Secondary | ICD-10-CM | POA: Diagnosis not present

## 2013-05-08 DIAGNOSIS — F0281 Dementia in other diseases classified elsewhere with behavioral disturbance: Secondary | ICD-10-CM

## 2013-05-08 DIAGNOSIS — R52 Pain, unspecified: Secondary | ICD-10-CM

## 2013-05-08 DIAGNOSIS — K5900 Constipation, unspecified: Secondary | ICD-10-CM

## 2013-05-08 NOTE — Progress Notes (Signed)
PROGRESS NOTE  DATE: 05-08-13  FACILITY: Nursing Home Location: [facility]  LEVEL OF CARE: SNF (31)  Routine Visit  CHIEF COMPLAINT:  Manage seizure disorder, constipation and dementia  HISTORY OF PRESENT ILLNESS:  REASSESSMENT OF ONGOING PROBLEM(S):  SEIZURE DISORDER: The patient's seizure disorder remains stable. No complications reported from the medications presently being used. Staff do not report any recent seizure activity. Patient is a poor historian.  CONSTIPATION: The constipation remains stable. No complications from the medications presently being used. Staff deny ongoing constipation, abdominal pain, nausea or vomiting.  DEMENTIA: The dementia remaines stable and continues to function adequately in the current living environment with supervision.  The patient has had little changes in behavior. No complications noted from the medications presently being used. Patient does not follow commands.  PAST MEDICAL HISTORY : Reviewed.  No changes.  CURRENT MEDICATIONS: Reviewed per [facility]  REVIEW OF SYSTEMS: Unobtainable secondary to cerebral palsy and dementia  PHYSICAL EXAMINATION  VS:  T 98      P 70      RR 20      BP 118/67     POX % 98     WT (Lb)  GENERAL: no acute distress, normal body habitus NECK: supple, trachea midline, no neck masses, no thyroid tenderness, no thyromegaly RESPIRATORY: breathing is even & unlabored, BS CTAB CARDIAC: RRR, no murmur,no extra heart sounds, no edema GI: abdomen soft, normal BS, no masses, no tenderness, no hepatomegaly, no splenomegaly PSYCHIATRIC: the patient is alert & disoriented and, affect & behavior appropriate  LABS/RADIOLOGY:  6-14 CBC normal, total protein 5.7 otherwise liver profile normal, phenobarbital 16.2  12/13 CBC normal, total protein 5.4, albumin 3.3 otherwise CMP normal, hemoglobin A1c 5.4, Tegretol level 7.3, phenobarbital level 18.4  ASSESSMENT/PLAN:  seizure  disorder-well-controlled. constipation-well-controlled. dementia-advanced. chronic pain-patient appears comfortable. anxiety-stable. depression- no active issues. Check BMP  CPT CODE: 16109

## 2013-05-09 DIAGNOSIS — D649 Anemia, unspecified: Secondary | ICD-10-CM | POA: Diagnosis not present

## 2013-05-26 ENCOUNTER — Other Ambulatory Visit: Payer: Self-pay | Admitting: *Deleted

## 2013-05-26 MED ORDER — PHENOBARBITAL 97.2 MG PO TABS: 97.2000 mg | ORAL_TABLET | Freq: Every day | ORAL | Status: DC

## 2013-05-28 DIAGNOSIS — M79609 Pain in unspecified limb: Secondary | ICD-10-CM | POA: Diagnosis not present

## 2013-05-28 DIAGNOSIS — B351 Tinea unguium: Secondary | ICD-10-CM | POA: Diagnosis not present

## 2013-06-04 ENCOUNTER — Non-Acute Institutional Stay (SKILLED_NURSING_FACILITY): Payer: Medicare Other | Admitting: Internal Medicine

## 2013-06-04 DIAGNOSIS — R569 Unspecified convulsions: Secondary | ICD-10-CM

## 2013-06-04 DIAGNOSIS — F0281 Dementia in other diseases classified elsewhere with behavioral disturbance: Secondary | ICD-10-CM | POA: Diagnosis not present

## 2013-06-04 DIAGNOSIS — K5900 Constipation, unspecified: Secondary | ICD-10-CM | POA: Diagnosis not present

## 2013-06-04 DIAGNOSIS — R52 Pain, unspecified: Secondary | ICD-10-CM | POA: Diagnosis not present

## 2013-06-04 NOTE — Progress Notes (Signed)
PROGRESS NOTE  DATE: 06-04-13  FACILITY: Nursing Home Location: [facility]  LEVEL OF CARE: SNF (31)  Routine Visit  CHIEF COMPLAINT:  Manage seizure disorder, constipation and dementia  HISTORY OF PRESENT ILLNESS:  REASSESSMENT OF ONGOING PROBLEM(S):  SEIZURE DISORDER: The patient's seizure disorder remains stable. No complications reported from the medications presently being used. Staff do not report any recent seizure activity. Patient is a poor historian.  CONSTIPATION: The constipation remains stable. No complications from the medications presently being used. Staff deny ongoing constipation, abdominal pain, nausea or vomiting.  DEMENTIA: The dementia remaines stable and continues to function adequately in the current living environment with supervision.  The patient has had little changes in behavior. No complications noted from the medications presently being used. Patient does not follow commands.  PAST MEDICAL HISTORY : Reviewed.  No changes.  CURRENT MEDICATIONS: Reviewed per [facility]  REVIEW OF SYSTEMS: Unobtainable secondary to cerebral palsy and dementia  PHYSICAL EXAMINATION  VS:  T 97      P 64      RR 18     BP 114/72     POX % 96     WT (Lb)  GENERAL: no acute distress, normal body habitus NECK: supple, trachea midline, no neck masses, no thyroid tenderness, no thyromegaly RESPIRATORY: breathing is even & unlabored, BS CTAB CARDIAC: RRR, no murmur,no extra heart sounds, no edema GI: abdomen soft, normal BS, no masses, no tenderness, no hepatomegaly, no splenomegaly PSYCHIATRIC: the patient is alert & disoriented and, affect & behavior appropriate  LABS/RADIOLOGY:  8-14 BMP normal  6-14 CBC normal, total protein 5.7 otherwise liver profile normal, phenobarbital 16.2  12/13 CBC normal, total protein 5.4, albumin 3.3 otherwise CMP normal, hemoglobin A1c 5.4, Tegretol level 7.3, phenobarbital level 18.4  ASSESSMENT/PLAN:  seizure  disorder-well-controlled. constipation-well-controlled. dementia-advanced. chronic pain-patient appears comfortable. anxiety-stable. depression- no active issues.  CPT CODE: 08657

## 2013-06-11 DIAGNOSIS — Z23 Encounter for immunization: Secondary | ICD-10-CM | POA: Diagnosis not present

## 2013-07-01 DIAGNOSIS — H2510 Age-related nuclear cataract, unspecified eye: Secondary | ICD-10-CM | POA: Diagnosis not present

## 2013-07-01 DIAGNOSIS — H47219 Primary optic atrophy, unspecified eye: Secondary | ICD-10-CM | POA: Diagnosis not present

## 2013-07-01 DIAGNOSIS — H04129 Dry eye syndrome of unspecified lacrimal gland: Secondary | ICD-10-CM | POA: Diagnosis not present

## 2013-07-10 ENCOUNTER — Non-Acute Institutional Stay (SKILLED_NURSING_FACILITY): Payer: Medicare Other | Admitting: Adult Health

## 2013-07-10 DIAGNOSIS — R569 Unspecified convulsions: Secondary | ICD-10-CM | POA: Diagnosis not present

## 2013-07-10 DIAGNOSIS — F02818 Dementia in other diseases classified elsewhere, unspecified severity, with other behavioral disturbance: Secondary | ICD-10-CM

## 2013-07-10 DIAGNOSIS — R52 Pain, unspecified: Secondary | ICD-10-CM

## 2013-07-10 DIAGNOSIS — G8191 Hemiplegia, unspecified affecting right dominant side: Secondary | ICD-10-CM

## 2013-07-10 DIAGNOSIS — K5900 Constipation, unspecified: Secondary | ICD-10-CM | POA: Diagnosis not present

## 2013-07-10 DIAGNOSIS — F411 Generalized anxiety disorder: Secondary | ICD-10-CM

## 2013-07-10 DIAGNOSIS — F0281 Dementia in other diseases classified elsewhere with behavioral disturbance: Secondary | ICD-10-CM

## 2013-07-10 DIAGNOSIS — F3289 Other specified depressive episodes: Secondary | ICD-10-CM

## 2013-07-10 DIAGNOSIS — G8190 Hemiplegia, unspecified affecting unspecified side: Secondary | ICD-10-CM

## 2013-07-10 DIAGNOSIS — F329 Major depressive disorder, single episode, unspecified: Secondary | ICD-10-CM

## 2013-07-10 NOTE — Progress Notes (Signed)
Patient ID: Joshua Munoz, male   DOB: 1951-08-04, 62 y.o.   MRN: [id number]       PROGRESS NOTE  DATE: 07/10/13  FACILITY: Nursing Home Location: [facility]  LEVEL OF CARE: SNF (31)  Routine Visit  CHIEF COMPLAINT:  Manage seizure disorder, constipation and dementia  HISTORY OF PRESENT ILLNESS:  REASSESSMENT OF ONGOING PROBLEM(S):  DEPRESSION: The depression remains stable. Patient denies ongoing feelings of sadness, insomnia, anedhonia or lack of appetite. No complications reported from the medications currently being used. Staff do not report behavioral problems.  ANXIETY: The anxiety remains stable. Patient denies ongoing anxiety or irritability. No complications reported from the medications currently being used.  SEIZURE DISORDER: The patient's seizure disorder remains stable. No complications reported from the medications presently being used. Staff do not report any recent seizure activity. Patient is a poor historian.  PAST MEDICAL HISTORY : Reviewed.  No changes.  CURRENT MEDICATIONS: Reviewed per [facility]  REVIEW OF SYSTEMS: Unobtainable secondary to dementia  PHYSICAL EXAMINATION  VS:  T 98      P 70      RR 20     BP 118/67     POX 98 %      WT 203 (Lb)  GENERAL: no acute distress, normal body habitus NECK: supple, trachea midline, no neck masses, no thyroid tenderness, no thyromegaly RESPIRATORY: breathing is even & unlabored, BS CTAB CARDIAC: RRR, no murmur,no extra heart sounds, no edema GI: abdomen soft, normal BS, no masses, no tenderness, no hepatomegaly, no splenomegaly EXTREMITIES:  Right sided hemiplegia PSYCHIATRIC: the patient is alert & disoriented and, affect & behavior appropriate  LABS/RADIOLOGY:  8-14 BMP normal  6-14 CBC normal, total protein 5.7 otherwise liver profile normal, phenobarbital 16.2  12/13 CBC normal, total protein 5.4, albumin 3.3 otherwise CMP normal, hemoglobin A1c 5.4, Tegretol level 7.3, phenobarbital level  18.4  ASSESSMENT/PLAN:  seizure disorder-well-controlled. constipation-well-controlled. dementia-advanced. chronic pain-patient appears comfortable. anxiety-stable. depression- stable. Right hemiplegia - resident requires assistance with transfers, eating and toileting   CPT CODE: 40981

## 2013-08-01 ENCOUNTER — Non-Acute Institutional Stay (SKILLED_NURSING_FACILITY): Payer: Medicare Other | Admitting: Adult Health

## 2013-08-01 DIAGNOSIS — K5900 Constipation, unspecified: Secondary | ICD-10-CM

## 2013-08-01 DIAGNOSIS — F411 Generalized anxiety disorder: Secondary | ICD-10-CM | POA: Diagnosis not present

## 2013-08-01 DIAGNOSIS — G8190 Hemiplegia, unspecified affecting unspecified side: Secondary | ICD-10-CM

## 2013-08-01 DIAGNOSIS — F0281 Dementia in other diseases classified elsewhere with behavioral disturbance: Secondary | ICD-10-CM

## 2013-08-01 DIAGNOSIS — R569 Unspecified convulsions: Secondary | ICD-10-CM

## 2013-08-01 DIAGNOSIS — R52 Pain, unspecified: Secondary | ICD-10-CM

## 2013-08-01 DIAGNOSIS — G8191 Hemiplegia, unspecified affecting right dominant side: Secondary | ICD-10-CM

## 2013-08-01 DIAGNOSIS — F329 Major depressive disorder, single episode, unspecified: Secondary | ICD-10-CM

## 2013-08-01 NOTE — Progress Notes (Signed)
Patient ID: Joshua Munoz, male   DOB: [birth year], 62 y.o.   MRN: [id number]       PROGRESS NOTE  DATE: 08/01/13  FACILITY: Nursing Home Location: [facility]  LEVEL OF CARE: SNF (31)  Routine Visit  CHIEF COMPLAINT:  Manage seizure disorder, constipation and dementia  HISTORY OF PRESENT ILLNESS:  REASSESSMENT OF ONGOING PROBLEM(S):  DEMENTIA: The dementia remaines stable and continues to function adequately in the current living environment with supervision.  The patient has had little changes in behavior. No complications noted from the medications presently being used.  CONSTIPATION: The constipation remains stable. No complications from the medications presently being used. Patient denies ongoing constipation, abdominal pain, nausea or vomiting.  SEIZURE DISORDER: The patient's seizure disorder remains stable. No complications reported from the medications presently being used. Staff do not report any recent seizure activity. Patient is a poor historian.  PAST MEDICAL HISTORY : Reviewed.  No changes.  CURRENT MEDICATIONS: Reviewed per [facility]  REVIEW OF SYSTEMS: Unobtainable secondary to dementia  PHYSICAL EXAMINATION  VS:  T97.9     P76     RR 20     BP128/74    POX 97%      WT 205.2(Lb)  GENERAL: no acute distress, normal body habitus RESPIRATORY: breathing is even & unlabored, BS CTAB CARDIAC: RRR, no murmur,no extra heart sounds, no edema GI: abdomen soft, normal BS, no masses, no tenderness, no hepatomegaly, no splenomegaly EXTREMITIES:  Right sided hemiplegia PSYCHIATRIC: the patient is alert & disoriented and, affect & behavior appropriate  LABS/RADIOLOGY: 8-14 BMP normal 6-14 CBC normal, total protein 5.7 otherwise liver profile normal, phenobarbital 16.2 12/13 CBC normal, total protein 5.4, albumin 3.3 otherwise CMP normal, hemoglobin A1c 5.4, Tegretol level 7.3, phenobarbital level 18.4  ASSESSMENT/PLAN:  seizure disorder-well-controlled;  continue Tegretol constipation-well-controlled. dementia-advanced. chronic pain-patient appears comfortable. anxiety-stable; continue Ativan depression- stable; continue Celexa Right hemiplegia - resident requires assistance with transfers, eating and toileting   CPT CODE: 86578

## 2013-08-05 DIAGNOSIS — M24529 Contracture, unspecified elbow: Secondary | ICD-10-CM | POA: Diagnosis not present

## 2013-08-05 DIAGNOSIS — G8100 Flaccid hemiplegia affecting unspecified side: Secondary | ICD-10-CM | POA: Diagnosis not present

## 2013-08-05 DIAGNOSIS — G939 Disorder of brain, unspecified: Secondary | ICD-10-CM | POA: Diagnosis not present

## 2013-08-05 DIAGNOSIS — M24549 Contracture, unspecified hand: Secondary | ICD-10-CM | POA: Diagnosis not present

## 2013-08-15 DIAGNOSIS — M24529 Contracture, unspecified elbow: Secondary | ICD-10-CM | POA: Diagnosis not present

## 2013-08-15 DIAGNOSIS — IMO0002 Reserved for concepts with insufficient information to code with codable children: Secondary | ICD-10-CM | POA: Diagnosis not present

## 2013-08-15 DIAGNOSIS — G8100 Flaccid hemiplegia affecting unspecified side: Secondary | ICD-10-CM | POA: Diagnosis not present

## 2013-08-18 ENCOUNTER — Other Ambulatory Visit: Payer: Self-pay | Admitting: *Deleted

## 2013-08-18 DIAGNOSIS — IMO0002 Reserved for concepts with insufficient information to code with codable children: Secondary | ICD-10-CM | POA: Diagnosis not present

## 2013-08-18 DIAGNOSIS — M24529 Contracture, unspecified elbow: Secondary | ICD-10-CM | POA: Diagnosis not present

## 2013-08-18 DIAGNOSIS — G8100 Flaccid hemiplegia affecting unspecified side: Secondary | ICD-10-CM | POA: Diagnosis not present

## 2013-08-18 MED ORDER — PHENOBARBITAL 97.2 MG PO TABS: 97.2000 mg | ORAL_TABLET | Freq: Every day | ORAL | Status: DC

## 2013-08-20 DIAGNOSIS — G8100 Flaccid hemiplegia affecting unspecified side: Secondary | ICD-10-CM | POA: Diagnosis not present

## 2013-08-20 DIAGNOSIS — IMO0002 Reserved for concepts with insufficient information to code with codable children: Secondary | ICD-10-CM | POA: Diagnosis not present

## 2013-08-20 DIAGNOSIS — M24529 Contracture, unspecified elbow: Secondary | ICD-10-CM | POA: Diagnosis not present

## 2013-08-22 DIAGNOSIS — G8100 Flaccid hemiplegia affecting unspecified side: Secondary | ICD-10-CM | POA: Diagnosis not present

## 2013-08-22 DIAGNOSIS — IMO0002 Reserved for concepts with insufficient information to code with codable children: Secondary | ICD-10-CM | POA: Diagnosis not present

## 2013-08-22 DIAGNOSIS — M24529 Contracture, unspecified elbow: Secondary | ICD-10-CM | POA: Diagnosis not present

## 2013-08-25 DIAGNOSIS — R569 Unspecified convulsions: Secondary | ICD-10-CM | POA: Diagnosis not present

## 2013-08-25 DIAGNOSIS — Z125 Encounter for screening for malignant neoplasm of prostate: Secondary | ICD-10-CM | POA: Diagnosis not present

## 2013-08-25 DIAGNOSIS — D649 Anemia, unspecified: Secondary | ICD-10-CM | POA: Diagnosis not present

## 2013-08-29 ENCOUNTER — Non-Acute Institutional Stay (SKILLED_NURSING_FACILITY): Payer: Medicare Other | Admitting: Internal Medicine

## 2013-08-29 ENCOUNTER — Encounter: Payer: Self-pay | Admitting: Internal Medicine

## 2013-08-29 DIAGNOSIS — K5900 Constipation, unspecified: Secondary | ICD-10-CM

## 2013-08-29 DIAGNOSIS — F0281 Dementia in other diseases classified elsewhere with behavioral disturbance: Secondary | ICD-10-CM

## 2013-08-29 DIAGNOSIS — R52 Pain, unspecified: Secondary | ICD-10-CM | POA: Diagnosis not present

## 2013-08-29 DIAGNOSIS — R569 Unspecified convulsions: Secondary | ICD-10-CM

## 2013-08-29 NOTE — Progress Notes (Signed)
PROGRESS NOTE  DATE: 08-29-13  FACILITY: Nursing Home Location: [facility]  LEVEL OF CARE: SNF (31)  Routine Visit  CHIEF COMPLAINT:  Manage seizure disorder, constipation and dementia  HISTORY OF PRESENT ILLNESS:  REASSESSMENT OF ONGOING PROBLEM(S):  SEIZURE DISORDER: The patient's seizure disorder remains stable. No complications reported from the medications presently being used. Staff do not report any recent seizure activity. Patient is a poor historian.  CONSTIPATION: The constipation remains stable. No complications from the medications presently being used. Staff deny ongoing constipation, abdominal pain, nausea or vomiting.  DEMENTIA: The dementia remaines stable and continues to function adequately in the current living environment with supervision.  The patient has had little changes in behavior. No complications noted from the medications presently being used. Patient does not follow commands.  PAST MEDICAL HISTORY : Reviewed.  No changes.  CURRENT MEDICATIONS: Reviewed per [facility]  REVIEW OF SYSTEMS: Unobtainable secondary to cerebral palsy and dementia  PHYSICAL EXAMINATION  VS:  T 97.4      P 63      RR 18     BP 125/75     POX % 94      GENERAL: no acute distress, normal body habitus NECK: supple, trachea midline, no neck masses, no thyroid tenderness, no thyromegaly RESPIRATORY: breathing is even & unlabored, BS CTAB CARDIAC: RRR, no murmur,no extra heart sounds, no edema GI: abdomen soft, normal BS, no masses, no tenderness, no hepatomegaly, no splenomegaly PSYCHIATRIC: the patient is alert & disoriented and, affect & behavior appropriate  LABS/RADIOLOGY:  8-14 BMP normal  6-14 CBC normal, total protein 5.7 otherwise liver profile normal, phenobarbital 16.2  12/13 CBC normal, total protein 5.4, albumin 3.3 otherwise CMP normal, hemoglobin A1c 5.4, Tegretol level 7.3, phenobarbital level 18.4  ASSESSMENT/PLAN:  seizure  disorder-well-controlled. constipation-well-controlled. dementia-advanced. chronic pain-patient appears comfortable. anxiety-stable. depression- no active issues. Check CBC, liver profile, phenobarbital level and Tegretol level  CPT CODE: 16109

## 2013-09-01 DIAGNOSIS — R569 Unspecified convulsions: Secondary | ICD-10-CM | POA: Diagnosis not present

## 2013-09-01 DIAGNOSIS — D649 Anemia, unspecified: Secondary | ICD-10-CM | POA: Diagnosis not present

## 2013-09-10 DIAGNOSIS — B351 Tinea unguium: Secondary | ICD-10-CM | POA: Diagnosis not present

## 2013-09-10 DIAGNOSIS — M79609 Pain in unspecified limb: Secondary | ICD-10-CM | POA: Diagnosis not present

## 2013-09-12 ENCOUNTER — Other Ambulatory Visit: Payer: Self-pay | Admitting: *Deleted

## 2013-09-12 MED ORDER — LORAZEPAM 0.5 MG PO TABS: 0.5000 mg | ORAL_TABLET | Freq: Every day | ORAL | Status: DC

## 2013-09-19 ENCOUNTER — Other Ambulatory Visit: Payer: Self-pay | Admitting: *Deleted

## 2013-09-19 MED ORDER — HYDROCODONE-ACETAMINOPHEN 5-325 MG PO TABS: ORAL_TABLET | ORAL | Status: DC

## 2013-09-25 ENCOUNTER — Other Ambulatory Visit: Payer: Self-pay | Admitting: *Deleted

## 2013-09-25 MED ORDER — LORAZEPAM 0.5 MG PO TABS: ORAL_TABLET | ORAL | Status: DC

## 2013-10-02 ENCOUNTER — Non-Acute Institutional Stay (SKILLED_NURSING_FACILITY): Payer: Medicare Other | Admitting: Internal Medicine

## 2013-10-02 DIAGNOSIS — R52 Pain, unspecified: Secondary | ICD-10-CM | POA: Diagnosis not present

## 2013-10-02 DIAGNOSIS — F0281 Dementia in other diseases classified elsewhere with behavioral disturbance: Secondary | ICD-10-CM | POA: Diagnosis not present

## 2013-10-02 DIAGNOSIS — K5900 Constipation, unspecified: Secondary | ICD-10-CM

## 2013-10-02 DIAGNOSIS — R569 Unspecified convulsions: Secondary | ICD-10-CM | POA: Diagnosis not present

## 2013-10-02 DIAGNOSIS — F02818 Dementia in other diseases classified elsewhere, unspecified severity, with other behavioral disturbance: Secondary | ICD-10-CM

## 2013-10-02 NOTE — Progress Notes (Signed)
         PROGRESS NOTE  DATE: 10-02-13  FACILITY: Nursing Home Location: [facility]  LEVEL OF CARE: SNF (31)  Routine Visit  CHIEF COMPLAINT:  Manage seizure disorder, constipation and dementia  HISTORY OF PRESENT ILLNESS:  REASSESSMENT OF ONGOING PROBLEM(S):  SEIZURE DISORDER: The patient's seizure disorder remains stable. No complications reported from the medications presently being used. Staff do not report any recent seizure activity. Patient is a poor historian.  CONSTIPATION: The constipation remains stable. No complications from the medications presently being used. Staff deny ongoing constipation, abdominal pain, nausea or vomiting.  DEMENTIA: The dementia remaines stable and continues to function adequately in the current living environment with supervision.  The patient has had little changes in behavior. No complications noted from the medications presently being used. Patient does not follow commands.  PAST MEDICAL HISTORY : Reviewed.  No changes.  [facility]: [facility]  REVIEW OF SYSTEMS: Unobtainable secondary to cerebral palsy and dementia  PHYSICAL EXAMINATION  VS:  T 97.7      P 67      RR 18     BP 107/66     POX % 95      GENERAL: no acute distress, normal body habitus NECK: supple, trachea midline, no neck masses, no thyroid tenderness, no thyromegaly RESPIRATORY: breathing is even & unlabored, BS CTAB CARDIAC: RRR, no murmur,no extra heart sounds, no edema GI: abdomen soft, normal BS, no masses, no tenderness, no hepatomegaly, no splenomegaly PSYCHIATRIC: the patient is alert & disoriented and, affect & behavior appropriate  LABS/RADIOLOGY:  12-14 Tegretol level her 1.3, CBC normal, total protein 5.6 otherwise liver profile normal, phenobarbital 18.5  8-14 BMP normal  6-14 CBC normal, total protein 5.7 otherwise liver profile normal, phenobarbital 16.2  12/13 CBC normal, total protein 5.4, albumin 3.3 otherwise  CMP normal, hemoglobin A1c 5.4, Tegretol level 7.3, phenobarbital level 18.4  ASSESSMENT/PLAN:  seizure disorder-well-controlled. constipation-well-controlled. dementia-advanced. chronic pain-patient appears comfortable. anxiety-stable. depression- no active issues  CPT CODE: [id number]

## 2013-10-20 ENCOUNTER — Other Ambulatory Visit: Payer: Self-pay | Admitting: *Deleted

## 2013-10-20 MED ORDER — HYDROCODONE-ACETAMINOPHEN 5-325 MG PO TABS: ORAL_TABLET | ORAL | Status: DC

## 2013-10-20 NOTE — Telephone Encounter (Signed)
[facility] 

## 2013-11-06 ENCOUNTER — Encounter: Payer: Self-pay | Admitting: *Deleted

## 2013-11-14 ENCOUNTER — Encounter: Payer: Self-pay | Admitting: Adult Health

## 2013-11-14 ENCOUNTER — Non-Acute Institutional Stay (SKILLED_NURSING_FACILITY): Payer: Medicare Other | Admitting: Adult Health

## 2013-11-14 DIAGNOSIS — R52 Pain, unspecified: Secondary | ICD-10-CM

## 2013-11-14 DIAGNOSIS — F411 Generalized anxiety disorder: Secondary | ICD-10-CM | POA: Diagnosis not present

## 2013-11-14 DIAGNOSIS — G8190 Hemiplegia, unspecified affecting unspecified side: Secondary | ICD-10-CM

## 2013-11-14 DIAGNOSIS — F32A Depression, unspecified: Secondary | ICD-10-CM

## 2013-11-14 DIAGNOSIS — F329 Major depressive disorder, single episode, unspecified: Secondary | ICD-10-CM

## 2013-11-14 DIAGNOSIS — R569 Unspecified convulsions: Secondary | ICD-10-CM

## 2013-11-14 DIAGNOSIS — G8191 Hemiplegia, unspecified affecting right dominant side: Secondary | ICD-10-CM

## 2013-11-14 DIAGNOSIS — K5900 Constipation, unspecified: Secondary | ICD-10-CM

## 2013-11-14 DIAGNOSIS — F3289 Other specified depressive episodes: Secondary | ICD-10-CM

## 2013-11-14 NOTE — Progress Notes (Signed)
Patient ID: Joshua Munoz, male   DOB: 20-Oct-1950, 63 y.o.   MRN: [id number]         PROGRESS NOTE  DATE: 11/14/13  [facility]  LEVEL OF CARE: SNF (31)  Routine Visit  CHIEF COMPLAINT:  Manage seizure disorder, constipation and dementia  HISTORY OF PRESENT ILLNESS:  REASSESSMENT OF ONGOING PROBLEM(S):  ANXIETY: The anxiety remains stable. Patient denies ongoing anxiety or irritability. No complications reported from the medications currently being used.  DEPRESSION: The depression remains stable. Patient denies ongoing feelings of sadness, insomnia, anedhonia or lack of appetite. No complications reported from the medications currently being used. Staff do not report behavioral problems.  DEMENTIA: The dementia remaines stable and continues to function adequately in the current living environment with supervision.  The patient has had little changes in behavior. No complications noted from the medications presently being used. Patient does not follow commands.  PAST MEDICAL HISTORY : Reviewed.  No changes.  [facility]  REVIEW OF SYSTEMS: Unobtainable secondary to cerebral palsy and dementia  PHYSICAL EXAMINATION  GENERAL: no acute distress, normal body habitus NECK: supple, trachea midline, no neck masses, no thyroid tenderness, no thyromegaly RESPIRATORY: breathing is even & unlabored, BS CTAB CARDIAC: RRR, no murmur,no extra heart sounds, no edema GI: abdomen soft, normal BS, no masses, no tenderness, no hepatomegaly, no splenomegaly  EXTREMITIES: right hemiplegia PSYCHIATRIC: the patient is alert & disoriented and, affect & behavior appropriate  LABS/RADIOLOGY: 12-14 Tegretol level  1.3, CBC normal, total protein 5.6 otherwise liver profile normal,phenobarbital 18.5 8-14 BMP normal 6-14 CBC normal, total protein 5.7 otherwise liver profile normal, phenobarbital 16.2 12/13 CBC normal, total  protein 5.4, albumin 3.3 otherwise CMP normal, hemoglobin A1c 5.4, Tegretol level 7.3, phenobarbital level 18.4  ASSESSMENT/PLAN:  seizure disorder-well-controlled. constipation-well-controlled. dementia-advanced. chronic pain-patient appears comfortable. anxiety-stable. depression- no active issues  CPT CODE: [id number]  Joshua Munoz - NP [facility] [phone]

## 2013-11-17 ENCOUNTER — Other Ambulatory Visit: Payer: Self-pay | Admitting: *Deleted

## 2013-11-17 MED ORDER — HYDROCODONE-ACETAMINOPHEN 5-325 MG PO TABS: ORAL_TABLET | ORAL | Status: DC

## 2013-11-17 NOTE — Telephone Encounter (Signed)
[facility] 

## 2013-12-31 ENCOUNTER — Non-Acute Institutional Stay (SKILLED_NURSING_FACILITY): Payer: Medicare Other | Admitting: Internal Medicine

## 2013-12-31 DIAGNOSIS — F02818 Dementia in other diseases classified elsewhere, unspecified severity, with other behavioral disturbance: Secondary | ICD-10-CM

## 2013-12-31 DIAGNOSIS — K5900 Constipation, unspecified: Secondary | ICD-10-CM

## 2013-12-31 DIAGNOSIS — R569 Unspecified convulsions: Secondary | ICD-10-CM | POA: Diagnosis not present

## 2013-12-31 DIAGNOSIS — F0281 Dementia in other diseases classified elsewhere with behavioral disturbance: Secondary | ICD-10-CM | POA: Diagnosis not present

## 2013-12-31 DIAGNOSIS — R52 Pain, unspecified: Secondary | ICD-10-CM

## 2013-12-31 NOTE — Progress Notes (Signed)
          PROGRESS NOTE  DATE: 12-31-13  FACILITY: Nursing Home Location: [facility]  LEVEL OF CARE: SNF (31)  Routine Visit  CHIEF COMPLAINT:  Manage seizure disorder, constipation and dementia  HISTORY OF PRESENT ILLNESS:  REASSESSMENT OF ONGOING PROBLEM(S):  SEIZURE DISORDER: The patient's seizure disorder remains stable. No complications reported from the medications presently being used. Staff do not report any recent seizure activity. Patient is a poor historian.  CONSTIPATION: The constipation remains stable. No complications from the medications presently being used. Staff deny ongoing constipation, abdominal pain, nausea or vomiting.  DEMENTIA: The dementia remaines stable and continues to function adequately in the current living environment with supervision.  The patient has had little changes in behavior. No complications noted from the medications presently being used. Patient does not follow commands.  PAST MEDICAL HISTORY : Reviewed.  No changes.  [facility]S: [facility]  REVIEW OF SYSTEMS: Unobtainable secondary to cerebral palsy and dementia  PHYSICAL EXAMINATION    GENERAL: no acute distress, normal body habitus EYES: Normal sclerae, normal conjunctivae, no discharge NECK: supple, trachea midline, no neck masses, no thyroid tenderness, no thyromegaly LYMPHATICS: No cervical lymphadenopathy, no supraclavicular lymphadenopathy RESPIRATORY: breathing is even & unlabored, BS CTAB CARDIAC: RRR, no murmur,no extra heart sounds, no edema GI: abdomen soft, normal BS, no masses, no tenderness, no hepatomegaly, no splenomegaly PSYCHIATRIC: the patient is alert & disoriented and, affect & behavior appropriate  LABS/RADIOLOGY:  12-14 Tegretol level her 1.3, CBC normal, total protein 5.6 otherwise liver profile normal, phenobarbital 18.5  8-14 BMP normal  6-14 CBC normal, total protein 5.7 otherwise liver profile normal,  phenobarbital 16.2  12/13 CBC normal, total protein 5.4, albumin 3.3 otherwise CMP normal, hemoglobin A1c 5.4, Tegretol level 7.3, phenobarbital level 18.4  ASSESSMENT/PLAN:  seizure disorder-well-controlled. constipation-well-controlled. dementia-advanced. chronic pain-patient appears comfortable. anxiety-stable. depression- no active issues Check CMP  CPT CODE: [id number]  Newton PiggGayani Y. Kerry Doryasanayaka, MD [facility] [phone]

## 2014-01-01 DIAGNOSIS — I10 Essential (primary) hypertension: Secondary | ICD-10-CM | POA: Diagnosis not present

## 2014-01-12 ENCOUNTER — Other Ambulatory Visit: Payer: Self-pay | Admitting: *Deleted

## 2014-01-12 MED ORDER — HYDROCODONE-ACETAMINOPHEN 5-325 MG PO TABS: ORAL_TABLET | ORAL | Status: DC

## 2014-01-12 NOTE — Telephone Encounter (Signed)
[facility]

## 2014-02-09 ENCOUNTER — Other Ambulatory Visit: Payer: Self-pay | Admitting: *Deleted

## 2014-02-09 MED ORDER — HYDROCODONE-ACETAMINOPHEN 5-325 MG PO TABS: ORAL_TABLET | ORAL | Status: DC

## 2014-02-16 ENCOUNTER — Other Ambulatory Visit: Payer: Self-pay | Admitting: *Deleted

## 2014-02-16 MED ORDER — PHENOBARBITAL 97.2 MG PO TABS: 97.2000 mg | ORAL_TABLET | Freq: Every day | ORAL | Status: DC

## 2014-02-16 NOTE — Telephone Encounter (Signed)
[facility] 

## 2014-02-20 ENCOUNTER — Non-Acute Institutional Stay (SKILLED_NURSING_FACILITY): Payer: Medicare Other | Admitting: Adult Health

## 2014-02-20 ENCOUNTER — Encounter: Payer: Self-pay | Admitting: Adult Health

## 2014-02-20 DIAGNOSIS — F329 Major depressive disorder, single episode, unspecified: Secondary | ICD-10-CM | POA: Diagnosis not present

## 2014-02-20 DIAGNOSIS — F411 Generalized anxiety disorder: Secondary | ICD-10-CM

## 2014-02-20 DIAGNOSIS — K5900 Constipation, unspecified: Secondary | ICD-10-CM

## 2014-02-20 DIAGNOSIS — R569 Unspecified convulsions: Secondary | ICD-10-CM | POA: Diagnosis not present

## 2014-02-20 DIAGNOSIS — F3289 Other specified depressive episodes: Secondary | ICD-10-CM

## 2014-02-20 DIAGNOSIS — F02818 Dementia in other diseases classified elsewhere, unspecified severity, with other behavioral disturbance: Secondary | ICD-10-CM

## 2014-02-20 DIAGNOSIS — F32A Depression, unspecified: Secondary | ICD-10-CM

## 2014-02-20 DIAGNOSIS — F0281 Dementia in other diseases classified elsewhere with behavioral disturbance: Secondary | ICD-10-CM

## 2014-02-20 DIAGNOSIS — R52 Pain, unspecified: Secondary | ICD-10-CM

## 2014-02-25 DIAGNOSIS — M79609 Pain in unspecified limb: Secondary | ICD-10-CM | POA: Diagnosis not present

## 2014-02-25 DIAGNOSIS — B351 Tinea unguium: Secondary | ICD-10-CM | POA: Diagnosis not present

## 2014-03-05 DIAGNOSIS — R569 Unspecified convulsions: Secondary | ICD-10-CM | POA: Diagnosis not present

## 2014-03-05 LAB — HEPATIC FUNCTION PANEL
ALT: 12 U/L (ref 10–40)
AST: 12 U/L — AB (ref 14–40)
BILIRUBIN, TOTAL: 0.4 mg/dL

## 2014-03-05 LAB — CBC AND DIFFERENTIAL
HCT: 43 % (ref 41–53)
Hemoglobin: 13.6 g/dL (ref 13.5–17.5)
Platelets: 206 10*3/uL (ref 150–399)
WBC: 5.3 10^3/mL

## 2014-03-16 ENCOUNTER — Other Ambulatory Visit: Payer: Self-pay | Admitting: *Deleted

## 2014-03-16 MED ORDER — HYDROCODONE-ACETAMINOPHEN 5-325 MG PO TABS: ORAL_TABLET | ORAL | Status: DC

## 2014-03-25 NOTE — Progress Notes (Signed)
Patient ID: Joshua Munoz, male   DOB: 10/29/1950, 63 y.o.   MRN: [id number]         PROGRESS NOTE  DATE:  02/20/14  FACILITY: Nursing Home Location: [facility]  LEVEL OF CARE: SNF (31)  Routine Visit  CHIEF COMPLAINT:  Manage seizure disorder, constipation and dementia  HISTORY OF PRESENT ILLNESS:  REASSESSMENT OF ONGOING PROBLEM(S):  DEPRESSION: The depression remains stable. Patient denies ongoing feelings of sadness, insomnia, anedhonia or lack of appetite. No complications reported from the medications currently being used. Staff do not report behavioral problems.   SEIZURE DISORDER: The patient's seizure disorder remains stable. No complications reported from the medications presently being used. Staff do not report any recent seizure activity. Patient is a poor historian.  ANXIETY: The anxiety remains stable. Patient denies ongoing anxiety or irritability. No complications reported from the medications currently being used.   PAST MEDICAL HISTORY : Reviewed.  No changes.  [facility]  REVIEW OF SYSTEMS: Unobtainable secondary to cerebral palsy and dementia  PHYSICAL EXAMINATION    GENERAL: no acute distress, normal body habitus NECK: supple, trachea midline, no neck masses, no thyroid tenderness, no thyromegaly LYMPHATICS: No cervical lymphadenopathy, no supraclavicular lymphadenopathy RESPIRATORY: breathing is even & unlabored, BS CTAB CARDIAC: RRR, no murmur,no extra heart sounds, no edema GI: abdomen soft, normal BS, no masses, no tenderness, no hepatomegaly, no splenomegaly EXTREMITIES: Right hemiplegia; LUE full ROM, 5/5 strength; RUE contracted; BLE no movement; uses geri-chair PSYCHIATRIC: the patient is alert & disoriented and, affect & behavior appropriate  LABS/RADIOLOGY: 4/14 sodium 136 potassium 4.0 glucose 91 BUN 9 creatinine 0.7 calcium 8.5 total protein 5.8 albumin 3.5 GLOB 2.3 AST 13 ALT 12 12-14 Tegretol  level her 1.3, CBC normal, total protein 5.6 otherwise liver profile normal,  phenobarbital 18.5 8-14 BMP normal 6-14 CBC normal, total protein 5.7 otherwise liver profile normal, phenobarbital 16.2 12/13 CBC normal, total protein 5.4, albumin 3.3 otherwise CMP normal, hemoglobin A1c 5.4, Tegretol level 7.3, phenobarbital level 18.4  ASSESSMENT/PLAN:  seizure disorder - well-controlled; continue Tegretol Constipation - well-controlled; continue Senokot-S and Dulcolax suppository Dementia - advanced. Generalized pain - appears comfortable; continue Norco Anxiety - stable; continue Ativan Depression - stable; continue Celexa   CPT CODE: [id number]  Ella BodoMonina Vargas - NP [facility] [phone]

## 2014-03-26 ENCOUNTER — Encounter: Payer: Self-pay | Admitting: Adult Health

## 2014-03-26 ENCOUNTER — Non-Acute Institutional Stay (SKILLED_NURSING_FACILITY): Payer: Medicare Other | Admitting: Adult Health

## 2014-03-26 DIAGNOSIS — F3289 Other specified depressive episodes: Secondary | ICD-10-CM

## 2014-03-26 DIAGNOSIS — F411 Generalized anxiety disorder: Secondary | ICD-10-CM

## 2014-03-26 DIAGNOSIS — F32A Depression, unspecified: Secondary | ICD-10-CM

## 2014-03-26 DIAGNOSIS — F02818 Dementia in other diseases classified elsewhere, unspecified severity, with other behavioral disturbance: Secondary | ICD-10-CM

## 2014-03-26 DIAGNOSIS — R569 Unspecified convulsions: Secondary | ICD-10-CM

## 2014-03-26 DIAGNOSIS — F0281 Dementia in other diseases classified elsewhere with behavioral disturbance: Secondary | ICD-10-CM

## 2014-03-26 DIAGNOSIS — F329 Major depressive disorder, single episode, unspecified: Secondary | ICD-10-CM | POA: Diagnosis not present

## 2014-03-26 DIAGNOSIS — R52 Pain, unspecified: Secondary | ICD-10-CM

## 2014-04-07 ENCOUNTER — Encounter: Payer: Self-pay | Admitting: Adult Health

## 2014-04-07 NOTE — Progress Notes (Signed)
Patient ID: Joshua Munoz, male   DOB: June 29, 1951, 63 y.o.   MRN: [id number]         PROGRESS NOTE  DATE:  03/26/14  FACILITY: Nursing Home Location: [facility]  LEVEL OF CARE: SNF (31)  Routine Visit  CHIEF COMPLAINT:  Manage seizure disorder, constipation and dementia  HISTORY OF PRESENT ILLNESS:  REASSESSMENT OF ONGOING PROBLEM(S):  DEMENTIA: The dementia remaines stable and continues to function adequately in the current living environment with supervision.  The patient has had little changes in behavior. No medications presently being used.  CONSTIPATION: The constipation remains stable. No complications from the medications presently being used. Patient denies ongoing constipation, abdominal pain, nausea or vomiting.  SEIZURE DISORDER: The patient's seizure disorder remains stable. No complications reported from the medications presently being used. Staff do not report any recent seizure activity. Patient is a poor historian.  PAST MEDICAL HISTORY : Reviewed.  No changes.  CURRENT MEDICATIONS: [facility]  REVIEW OF SYSTEMS: Unobtainable secondary to cerebral palsy and dementia  PHYSICAL EXAMINATION    GENERAL: no acute distress, normal body habitus NECK: supple, trachea midline, no neck masses, no thyroid tenderness, no thyromegaly RESPIRATORY: breathing is even & unlabored, BS CTAB CARDIAC: RRR, no murmur,no extra heart sounds, no edema GI: abdomen soft, normal BS, no masses, no tenderness, no hepatomegaly, no splenomegaly EXTREMITIES: Right hemiplegia; LUE full ROM, 5/5 strength; RUE contracted; BLE no movement; uses geri-chair PSYCHIATRIC: the patient is alert & disoriented and, affect & behavior appropriate  LABS/RADIOLOGY: 03/05/14 phenobarbital 18.2 WBC 5.3 hemoglobin 13.6 hematocrit 43.2 total protein 5.3 albumin 3.2 globulin 2.1 total bilirubin 0.4 direct bilirubin 0.1 ALP 91 AST 12 ALT 12 Tegretol 8.380 4/14 sodium 136 potassium 4.0  glucose 91 BUN 9 creatinine 0.7 calcium 8.5 total protein 5.8 albumin 3.5 GLOB 2.3 AST 13 ALT 12 12-14 Tegretol level her 1.3, CBC normal, total protein 5.6 otherwise liver profile normal,  phenobarbital 18.5 8-14 BMP normal 6-14 CBC normal, total protein 5.7 otherwise liver profile normal, phenobarbital 16.2 12/13 CBC normal, total protein 5.4, albumin 3.3 otherwise CMP normal, hemoglobin A1c 5.4, Tegretol level 7.3, phenobarbital level 18.4  ASSESSMENT/PLAN:  seizure disorder - well-controlled; continue Tegretol and phenobarbital Constipation - well-controlled; continue Senokot-S and Dulcolax suppository Dementia - advanced. Generalized pain - appears comfortable; continue Norco Anxiety - stable; continue Ativan Depression - stable; continue Celexa   CPT CODE: [id number]  Ella BodoMonina Vargas - NP [facility] [phone]

## 2014-04-20 ENCOUNTER — Other Ambulatory Visit: Payer: Self-pay | Admitting: *Deleted

## 2014-04-20 MED ORDER — LORAZEPAM 0.5 MG PO TABS: ORAL_TABLET | ORAL | Status: DC

## 2014-04-20 NOTE — Telephone Encounter (Signed)
[facility] 

## 2014-04-30 ENCOUNTER — Non-Acute Institutional Stay (SKILLED_NURSING_FACILITY): Payer: Medicare Other | Admitting: Adult Health

## 2014-04-30 ENCOUNTER — Encounter: Payer: Self-pay | Admitting: Adult Health

## 2014-04-30 DIAGNOSIS — F0281 Dementia in other diseases classified elsewhere with behavioral disturbance: Secondary | ICD-10-CM

## 2014-04-30 DIAGNOSIS — F3289 Other specified depressive episodes: Secondary | ICD-10-CM | POA: Diagnosis not present

## 2014-04-30 DIAGNOSIS — F329 Major depressive disorder, single episode, unspecified: Secondary | ICD-10-CM

## 2014-04-30 DIAGNOSIS — R569 Unspecified convulsions: Secondary | ICD-10-CM | POA: Diagnosis not present

## 2014-04-30 DIAGNOSIS — F411 Generalized anxiety disorder: Secondary | ICD-10-CM | POA: Diagnosis not present

## 2014-04-30 DIAGNOSIS — K5900 Constipation, unspecified: Secondary | ICD-10-CM

## 2014-04-30 DIAGNOSIS — F02818 Dementia in other diseases classified elsewhere, unspecified severity, with other behavioral disturbance: Secondary | ICD-10-CM | POA: Diagnosis not present

## 2014-04-30 DIAGNOSIS — F32A Depression, unspecified: Secondary | ICD-10-CM

## 2014-04-30 DIAGNOSIS — R52 Pain, unspecified: Secondary | ICD-10-CM

## 2014-04-30 NOTE — Progress Notes (Signed)
Patient ID: Joshua Munoz, male   DOB: [birth year], 63 y.o.   MRN: [id number]         PROGRESS NOTE  DATE:    04/30/14  [facility]  LEVEL OF CARE: SNF (31)  Routine Visit  CHIEF COMPLAINT:  Manage seizure disorder, constipation and dementia  HISTORY OF PRESENT ILLNESS:  REASSESSMENT OF ONGOING PROBLEM(S):  DEPRESSION: The depression remains stable. Patient denies ongoing feelings of sadness, insomnia, anedhonia or lack of appetite. No complications reported from the medications currently being used. Staff do not report behavioral problems.  DEMENTIA: The dementia remaines stable and continues to function adequately in the current living environment with supervision.  The patient has had little changes in behavior. No medications presently being used.  ANXIETY: The anxiety remains stable. Patient denies ongoing anxiety or irritability. No complications reported from the medications currently being used.   PAST MEDICAL HISTORY : Reviewed.  No changes.  [facility] MEDICATIONS: [facility]  REVIEW OF SYSTEMS: Unobtainable secondary to cerebral palsy and dementia  PHYSICAL EXAMINATION    GENERAL: no acute distress, normal body habitus EYES:  Lids open and close normally. No blepharitis, entropion or ectropion. NECK: supple, trachea midline, no neck masses, no thyroid tenderness, no thyromegaly RESPIRATORY: breathing is even & unlabored, BS CTAB CARDIAC: RRR, no murmur,no extra heart sounds, no edema GI: abdomen soft, normal BS, no masses, no tenderness, no hepatomegaly, no splenomegaly EXTREMITIES: Right hemiplegia; LUE full ROM, 5/5 strength; RUE contracted; BLE no movement; uses geri-chair PSYCHIATRIC: the patient is alert & disoriented and, affect & behavior appropriate  LABS/RADIOLOGY: 03/05/14 phenobarbital 18.2 WBC 5.3 hemoglobin 13.6 hematocrit 43.2 total protein 5.3 albumin 3.2 globulin 2.1 total bilirubin 0.4 direct  bilirubin 0.1 ALP 91 AST 12 ALT 12 Tegretol 8.380 4/14 sodium 136 potassium 4.0 glucose 91 BUN 9 creatinine 0.7 calcium 8.5 total protein 5.8 albumin 3.5 GLOB 2.3 AST 13 ALT 12 12-14 Tegretol level her 1.3, CBC normal, total protein 5.6 otherwise liver profile normal,  phenobarbital 18.5 8-14 BMP normal 6-14 CBC normal, total protein 5.7 otherwise liver profile normal, phenobarbital 16.2 12/13 CBC normal, total protein 5.4, albumin 3.3 otherwise CMP normal, hemoglobin A1c 5.4, Tegretol level 7.3, phenobarbital level 18.4  ASSESSMENT/PLAN:  seizure disorder - well-controlled; continue Tegretol and phenobarbital Constipation - well-controlled; continue Senokot-S and Dulcolax suppository Dementia - advanced. Generalized pain - appears comfortable; continue Norco Anxiety - stable; continue Ativan Depression - stable; continue Celexa   CPT CODE: [id number]  Ella BodoMonina Vargas - NP [facility] [phone]

## 2014-05-19 ENCOUNTER — Other Ambulatory Visit: Payer: Self-pay | Admitting: *Deleted

## 2014-05-19 MED ORDER — HYDROCODONE-ACETAMINOPHEN 5-325 MG PO TABS: ORAL_TABLET | ORAL | Status: DC

## 2014-05-19 NOTE — Telephone Encounter (Signed)
[facility] 

## 2014-05-27 DIAGNOSIS — B351 Tinea unguium: Secondary | ICD-10-CM | POA: Diagnosis not present

## 2014-05-27 DIAGNOSIS — Z125 Encounter for screening for malignant neoplasm of prostate: Secondary | ICD-10-CM | POA: Diagnosis not present

## 2014-05-27 DIAGNOSIS — M79609 Pain in unspecified limb: Secondary | ICD-10-CM | POA: Diagnosis not present

## 2014-05-27 DIAGNOSIS — Z1283 Encounter for screening for malignant neoplasm of skin: Secondary | ICD-10-CM | POA: Diagnosis not present

## 2014-06-09 ENCOUNTER — Non-Acute Institutional Stay (SKILLED_NURSING_FACILITY): Payer: Medicare Other | Admitting: Adult Health

## 2014-06-09 ENCOUNTER — Encounter: Payer: Self-pay | Admitting: Adult Health

## 2014-06-09 DIAGNOSIS — F329 Major depressive disorder, single episode, unspecified: Secondary | ICD-10-CM | POA: Diagnosis not present

## 2014-06-09 DIAGNOSIS — R569 Unspecified convulsions: Secondary | ICD-10-CM

## 2014-06-09 DIAGNOSIS — F02818 Dementia in other diseases classified elsewhere, unspecified severity, with other behavioral disturbance: Secondary | ICD-10-CM

## 2014-06-09 DIAGNOSIS — F411 Generalized anxiety disorder: Secondary | ICD-10-CM | POA: Diagnosis not present

## 2014-06-09 DIAGNOSIS — F3289 Other specified depressive episodes: Secondary | ICD-10-CM

## 2014-06-09 DIAGNOSIS — F0281 Dementia in other diseases classified elsewhere with behavioral disturbance: Secondary | ICD-10-CM

## 2014-06-09 DIAGNOSIS — K5900 Constipation, unspecified: Secondary | ICD-10-CM

## 2014-06-09 DIAGNOSIS — R52 Pain, unspecified: Secondary | ICD-10-CM

## 2014-06-09 DIAGNOSIS — F32A Depression, unspecified: Secondary | ICD-10-CM

## 2014-06-09 NOTE — Progress Notes (Signed)
Patient ID: Joshua Munoz, male   DOB: 02-Jul-1951, 63 y.o.   MRN: [id number]          PROGRESS NOTE  DATE:    06/09/14  FAC[facility]: Nursing Home [facility]  LEVEL OF CARE: SNF (31)  Routine Visit  CHIEF COMPLAINT:  Manage seizure disorder, depression and dementia  HISTORY OF PRESENT ILLNESS:  REASSESSMENT OF ONGOING PROBLEM(S):  SEIZURE DISORDER: The patient's seizure disorder remains stable. No complications reported from the medications presently being used. Staff do not report any recent seizure activity.  CONSTIPATION: The constipation remains stable. No complications from the medications presently being used. Patient denies ongoing constipation, abdominal pain, nausea or vomiting.  DEMENTIA: The dementia remaines stable and continues to function adequately in the current living environment with supervision.  The patient has had little changes in behavior. No complications noted from the medications presently being used.  PAST MEDICAL HISTORY : Reviewed.  No changes.  CURRENT MEDICATIONS: [facility]  REVIEW OF SYSTEMS: Unobtainable secondary to cerebral palsy and dementia  PHYSICAL EXAMINATION    GENERAL: no acute distress, normal body habitus MOUTH:  Teeth are without lesions. Oral mucosa is moist and without lesions NECK: supple, trachea midline, no neck masses, no thyroid tenderness, no thyromegaly RESPIRATORY: breathing is even & unlabored, BS CTAB CARDIAC: RRR, no murmur,no extra heart sounds, no edema GI: abdomen soft, normal BS, no masses, no tenderness, no hepatomegaly, no splenomegaly EXTREMITIES: Right hemiplegia; LUE full ROM, 5/5 strength; RUE contracted; BLE no movement; uses geri-chair PSYCHIATRIC: the patient is alert & disoriented and, affect & behavior appropriate  LABS/RADIOLOGY: 05/27/14  PSA 0.5 03/05/14 phenobarbital 18.2 WBC 5.3 hemoglobin 13.6 hematocrit 43.2 total protein 5.3 albumin 3.2 globulin 2.1 total  bilirubin 0.4 direct bilirubin 0.1 ALP 91 AST 12 ALT 12 Tegretol 8.380 4/14 sodium 136 potassium 4.0 glucose 91 BUN 9 creatinine 0.7 calcium 8.5 total protein 5.8 albumin 3.5 GLOB 2.3 AST 13 ALT 12 12-14 Tegretol level her 1.3, CBC normal, total protein 5.6 otherwise liver profile normal,  phenobarbital 18.5 8-14 BMP normal 6-14 CBC normal, total protein 5.7 otherwise liver profile normal, phenobarbital 16.2 12/13 CBC normal, total protein 5.4, albumin 3.3 otherwise CMP normal, hemoglobin A1c 5.4, Tegretol level 7.3, phenobarbital level 18.4  ASSESSMENT/PLAN:  seizure disorder - well-controlled; continue Tegretol and phenobarbital Constipation - well-controlled; continue Senokot-S and Dulcolax suppository Dementia - advanced. Generalized pain - appears comfortable; continue Norco Anxiety - stable; continue Ativan Depression - stable; continue Celexa   CPT CODE: [id number]  Ella BodoMonina Vargas - NP [facility] [phone]

## 2014-06-17 ENCOUNTER — Other Ambulatory Visit: Payer: Self-pay | Admitting: *Deleted

## 2014-06-17 MED ORDER — HYDROCODONE-ACETAMINOPHEN 5-325 MG PO TABS: ORAL_TABLET | ORAL | Status: DC

## 2014-06-17 NOTE — Telephone Encounter (Signed)
[facility] 

## 2014-06-19 ENCOUNTER — Other Ambulatory Visit: Payer: Self-pay | Admitting: *Deleted

## 2014-06-19 MED ORDER — PHENOBARBITAL 97.2 MG PO TABS: 97.2000 mg | ORAL_TABLET | Freq: Every day | ORAL | Status: DC

## 2014-06-19 NOTE — Telephone Encounter (Signed)
[facility] 

## 2014-06-30 DIAGNOSIS — H2513 Age-related nuclear cataract, bilateral: Secondary | ICD-10-CM | POA: Diagnosis not present

## 2014-06-30 DIAGNOSIS — H47213 Primary optic atrophy, bilateral: Secondary | ICD-10-CM | POA: Diagnosis not present

## 2014-06-30 LAB — HM DIABETES EYE EXAM

## 2014-07-09 ENCOUNTER — Non-Acute Institutional Stay (SKILLED_NURSING_FACILITY): Payer: Medicare Other | Admitting: Internal Medicine

## 2014-07-09 DIAGNOSIS — R52 Pain, unspecified: Secondary | ICD-10-CM | POA: Diagnosis not present

## 2014-07-09 DIAGNOSIS — F0281 Dementia in other diseases classified elsewhere with behavioral disturbance: Secondary | ICD-10-CM

## 2014-07-09 DIAGNOSIS — R569 Unspecified convulsions: Secondary | ICD-10-CM | POA: Diagnosis not present

## 2014-07-09 DIAGNOSIS — K5900 Constipation, unspecified: Secondary | ICD-10-CM | POA: Diagnosis not present

## 2014-07-09 DIAGNOSIS — F02818 Dementia in other diseases classified elsewhere, unspecified severity, with other behavioral disturbance: Secondary | ICD-10-CM

## 2014-07-10 NOTE — Progress Notes (Signed)
         PROGRESS NOTE  DATE: 07-09-14  FACILITY: Nursing Home Location: [facility]  LEVEL OF CARE: SNF (31)  Routine Visit  CHIEF COMPLAINT:  Manage seizure disorder, constipation and dementia  HISTORY OF PRESENT ILLNESS:  REASSESSMENT OF ONGOING PROBLEM(S):  SEIZURE DISORDER: The patient's seizure disorder remains stable. No complications reported from the medications presently being used. Staff do not report any recent seizure activity. Patient is a poor historian.  CONSTIPATION: The constipation remains stable. No complications from the medications presently being used. Staff deny ongoing constipation, abdominal pain, nausea or vomiting.  DEMENTIA: The dementia remaines stable and continues to function adequately in the current living environment with supervision.  The patient has had little changes in behavior. No complications noted from the medications presently being used. Patient does not follow commands.  PAST MEDICAL HISTORY : Reviewed.  No changes.  [facility] MEDICATIONS: [facility]  REVIEW OF SYSTEMS: Unobtainable secondary to cerebral palsy and dementia  PHYSICAL EXAMINATION    GENERAL: no acute distress, normal body habitus EYES: Normal sclerae, normal conjunctivae, no discharge NECK: supple, trachea midline, no neck masses, no thyroid tenderness, no thyromegaly LYMPHATICS: No cervical lymphadenopathy, no supraclavicular lymphadenopathy RESPIRATORY: breathing is even & unlabored, BS CTAB CARDIAC: RRR, no murmur,no extra heart sounds, no edema GI: abdomen soft, normal BS, no masses, no tenderness, no hepatomegaly, no splenomegaly PSYCHIATRIC: the patient is alert & disoriented and, affect & behavior appropriate  LABS/RADIOLOGY: 9-15 PSA 0.5 6-15 phenobarbital level 18.2, cbc nl, tp 5.3, alb 3.2 ow liver profile nl, tegretol level 8.38 4-15 tp 5.8 ow cmp nl 12-14 Tegretol level her 1.3, CBC normal, total protein 5.6 otherwise liver  profile normal, phenobarbital 18.5  8-14 BMP normal  6-14 CBC normal, total protein 5.7 otherwise liver profile normal, phenobarbital 16.2  12/13 CBC normal, total protein 5.4, albumin 3.3 otherwise CMP normal, hemoglobin A1c 5.4, Tegretol level 7.3, phenobarbital level 18.4  ASSESSMENT/PLAN:  seizure disorder-well-controlled. constipation-well-controlled. dementia-advanced. chronic pain-patient appears comfortable. anxiety-stable. depression- no active issues  CPT CODE: [id number]  Newton PiggGayani Y. Kerry Doryasanayaka, MD [facility] [phone]

## 2014-07-15 ENCOUNTER — Other Ambulatory Visit: Payer: Self-pay | Admitting: *Deleted

## 2014-07-15 MED ORDER — HYDROCODONE-ACETAMINOPHEN 5-325 MG PO TABS: ORAL_TABLET | ORAL | Status: DC

## 2014-07-15 NOTE — Telephone Encounter (Signed)
[facility] 

## 2014-08-19 DIAGNOSIS — R569 Unspecified convulsions: Secondary | ICD-10-CM | POA: Diagnosis not present

## 2014-08-19 LAB — CBC AND DIFFERENTIAL
Hemoglobin: 13.9 g/dL (ref 13.5–17.5)
WBC: 5.3 10^3/mL

## 2014-09-02 DIAGNOSIS — M79675 Pain in left toe(s): Secondary | ICD-10-CM | POA: Diagnosis not present

## 2014-09-02 DIAGNOSIS — M79674 Pain in right toe(s): Secondary | ICD-10-CM | POA: Diagnosis not present

## 2014-09-02 DIAGNOSIS — B351 Tinea unguium: Secondary | ICD-10-CM | POA: Diagnosis not present

## 2014-10-10 ENCOUNTER — Non-Acute Institutional Stay (SKILLED_NURSING_FACILITY): Payer: Medicare Other | Admitting: Adult Health

## 2014-10-10 ENCOUNTER — Encounter: Payer: Self-pay | Admitting: Adult Health

## 2014-10-10 DIAGNOSIS — F329 Major depressive disorder, single episode, unspecified: Secondary | ICD-10-CM | POA: Diagnosis not present

## 2014-10-10 DIAGNOSIS — R52 Pain, unspecified: Secondary | ICD-10-CM | POA: Diagnosis not present

## 2014-10-10 DIAGNOSIS — F0281 Dementia in other diseases classified elsewhere with behavioral disturbance: Secondary | ICD-10-CM

## 2014-10-10 DIAGNOSIS — K5900 Constipation, unspecified: Secondary | ICD-10-CM | POA: Diagnosis not present

## 2014-10-10 DIAGNOSIS — R569 Unspecified convulsions: Secondary | ICD-10-CM | POA: Diagnosis not present

## 2014-10-10 DIAGNOSIS — L0292 Furuncle, unspecified: Secondary | ICD-10-CM

## 2014-10-10 DIAGNOSIS — F419 Anxiety disorder, unspecified: Secondary | ICD-10-CM

## 2014-10-10 DIAGNOSIS — F32A Depression, unspecified: Secondary | ICD-10-CM

## 2014-10-10 DIAGNOSIS — F02818 Dementia in other diseases classified elsewhere, unspecified severity, with other behavioral disturbance: Secondary | ICD-10-CM
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
## 2014-10-14 ENCOUNTER — Other Ambulatory Visit: Payer: Self-pay | Admitting: *Deleted

## 2014-10-14 MED ORDER — HYDROCODONE-ACETAMINOPHEN 5-325 MG PO TABS: ORAL_TABLET | ORAL | Status: DC

## 2014-10-14 NOTE — Telephone Encounter (Signed)
[facility] 

## 2014-10-15 DIAGNOSIS — I10 Essential (primary) hypertension: Secondary | ICD-10-CM | POA: Diagnosis not present

## 2014-10-15 LAB — BASIC METABOLIC PANEL
BUN: 10 mg/dL (ref 4–21)
CREATININE: 0.6 mg/dL (ref <=1.3)
Glucose: 75 mg/dL
Potassium: 4.3 mmol/L (ref 3.4–5.3)
Sodium: 133 mmol/L — AB (ref 137–147)

## 2014-11-03 DIAGNOSIS — B351 Tinea unguium: Secondary | ICD-10-CM | POA: Diagnosis not present

## 2014-11-03 DIAGNOSIS — M79674 Pain in right toe(s): Secondary | ICD-10-CM | POA: Diagnosis not present

## 2014-11-03 DIAGNOSIS — M79675 Pain in left toe(s): Secondary | ICD-10-CM | POA: Diagnosis not present

## 2014-11-19 ENCOUNTER — Other Ambulatory Visit: Payer: Self-pay | Admitting: *Deleted

## 2014-11-19 MED ORDER — HYDROCODONE-ACETAMINOPHEN 5-325 MG PO TABS: ORAL_TABLET | ORAL | Status: DC

## 2014-11-19 NOTE — Telephone Encounter (Signed)
[facility] 

## 2014-11-25 DIAGNOSIS — M24529 Contracture, unspecified elbow: Secondary | ICD-10-CM | POA: Diagnosis not present

## 2014-11-25 DIAGNOSIS — M24541 Contracture, right hand: Secondary | ICD-10-CM | POA: Diagnosis not present

## 2014-11-26 DIAGNOSIS — M24541 Contracture, right hand: Secondary | ICD-10-CM | POA: Diagnosis not present

## 2014-11-26 DIAGNOSIS — M24529 Contracture, unspecified elbow: Secondary | ICD-10-CM | POA: Diagnosis not present

## 2014-11-27 DIAGNOSIS — M24529 Contracture, unspecified elbow: Secondary | ICD-10-CM | POA: Diagnosis not present

## 2014-11-27 DIAGNOSIS — M24541 Contracture, right hand: Secondary | ICD-10-CM | POA: Diagnosis not present

## 2014-11-30 DIAGNOSIS — M24541 Contracture, right hand: Secondary | ICD-10-CM | POA: Diagnosis not present

## 2014-11-30 DIAGNOSIS — M24529 Contracture, unspecified elbow: Secondary | ICD-10-CM | POA: Diagnosis not present

## 2014-12-01 DIAGNOSIS — M24541 Contracture, right hand: Secondary | ICD-10-CM | POA: Diagnosis not present

## 2014-12-01 DIAGNOSIS — M24529 Contracture, unspecified elbow: Secondary | ICD-10-CM | POA: Diagnosis not present

## 2014-12-18 ENCOUNTER — Other Ambulatory Visit: Payer: Self-pay | Admitting: *Deleted

## 2014-12-18 MED ORDER — PHENOBARBITAL 97.2 MG PO TABS: 97.2000 mg | ORAL_TABLET | Freq: Every day | ORAL | Status: DC

## 2014-12-18 NOTE — Telephone Encounter (Signed)
[facility] 

## 2015-01-07 ENCOUNTER — Non-Acute Institutional Stay (SKILLED_NURSING_FACILITY): Payer: Medicare Other | Admitting: Adult Health

## 2015-01-07 ENCOUNTER — Encounter: Payer: Self-pay | Admitting: Adult Health

## 2015-01-07 DIAGNOSIS — F419 Anxiety disorder, unspecified: Secondary | ICD-10-CM

## 2015-01-07 DIAGNOSIS — F329 Major depressive disorder, single episode, unspecified: Secondary | ICD-10-CM

## 2015-01-07 DIAGNOSIS — R52 Pain, unspecified: Secondary | ICD-10-CM

## 2015-01-07 DIAGNOSIS — F0281 Dementia in other diseases classified elsewhere with behavioral disturbance: Secondary | ICD-10-CM

## 2015-01-07 DIAGNOSIS — R569 Unspecified convulsions: Secondary | ICD-10-CM | POA: Diagnosis not present

## 2015-01-07 DIAGNOSIS — J302 Other seasonal allergic rhinitis: Secondary | ICD-10-CM | POA: Diagnosis not present

## 2015-01-07 DIAGNOSIS — E871 Hypo-osmolality and hyponatremia: Secondary | ICD-10-CM

## 2015-01-07 DIAGNOSIS — K5900 Constipation, unspecified: Secondary | ICD-10-CM

## 2015-01-07 DIAGNOSIS — F02818 Dementia in other diseases classified elsewhere, unspecified severity, with other behavioral disturbance: Secondary | ICD-10-CM

## 2015-01-07 DIAGNOSIS — F32A Depression, unspecified: Secondary | ICD-10-CM
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
## 2015-01-08 DIAGNOSIS — Z79899 Other long term (current) drug therapy: Secondary | ICD-10-CM | POA: Diagnosis not present

## 2015-01-08 DIAGNOSIS — I10 Essential (primary) hypertension: Secondary | ICD-10-CM | POA: Diagnosis not present

## 2015-02-03 DIAGNOSIS — G40301 Generalized idiopathic epilepsy and epileptic syndromes, not intractable, with status epilepticus: Secondary | ICD-10-CM | POA: Diagnosis not present

## 2015-02-03 DIAGNOSIS — I10 Essential (primary) hypertension: Secondary | ICD-10-CM | POA: Diagnosis not present

## 2015-02-06 LAB — CBC AND DIFFERENTIAL
HCT: 38 % — AB (ref 41–53)
HEMOGLOBIN: 12.8 g/dL — AB (ref 13.5–17.5)
PLATELETS: 231 10*3/uL (ref 150–399)
WBC: 5.6 10^3/mL

## 2015-02-06 LAB — HEPATIC FUNCTION PANEL
ALK PHOS: 86 U/L (ref 25–125)
ALT: 11 U/L (ref 10–40)
AST: 11 U/L — AB (ref 14–40)
Bilirubin, Total: 0.4 mg/dL

## 2015-03-10 ENCOUNTER — Non-Acute Institutional Stay (SKILLED_NURSING_FACILITY): Payer: Medicare Other | Admitting: Internal Medicine

## 2015-03-10 DIAGNOSIS — G40909 Epilepsy, unspecified, not intractable, without status epilepticus: Secondary | ICD-10-CM

## 2015-03-10 DIAGNOSIS — E46 Unspecified protein-calorie malnutrition: Secondary | ICD-10-CM | POA: Insufficient documentation

## 2015-03-10 DIAGNOSIS — F329 Major depressive disorder, single episode, unspecified: Secondary | ICD-10-CM

## 2015-03-10 DIAGNOSIS — F418 Other specified anxiety disorders: Secondary | ICD-10-CM

## 2015-03-10 DIAGNOSIS — F0390 Unspecified dementia without behavioral disturbance: Secondary | ICD-10-CM

## 2015-03-10 DIAGNOSIS — J302 Other seasonal allergic rhinitis: Secondary | ICD-10-CM | POA: Diagnosis not present

## 2015-03-10 DIAGNOSIS — R1310 Dysphagia, unspecified: Secondary | ICD-10-CM

## 2015-03-10 DIAGNOSIS — K5900 Constipation, unspecified: Secondary | ICD-10-CM | POA: Diagnosis not present

## 2015-03-10 DIAGNOSIS — G8191 Hemiplegia, unspecified affecting right dominant side: Secondary | ICD-10-CM

## 2015-03-10 DIAGNOSIS — G8190 Hemiplegia, unspecified affecting unspecified side: Secondary | ICD-10-CM

## 2015-03-10 DIAGNOSIS — F0393 Unspecified dementia, unspecified severity, with mood disturbance: Secondary | ICD-10-CM
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
## 2015-03-24 DIAGNOSIS — Z79899 Other long term (current) drug therapy: Secondary | ICD-10-CM | POA: Diagnosis not present

## 2015-03-24 DIAGNOSIS — G40301 Generalized idiopathic epilepsy and epileptic syndromes, not intractable, with status epilepticus: Secondary | ICD-10-CM | POA: Diagnosis not present

## 2015-04-30 ENCOUNTER — Non-Acute Institutional Stay (SKILLED_NURSING_FACILITY): Payer: Medicare Other | Admitting: Adult Health

## 2015-04-30 ENCOUNTER — Encounter: Payer: Self-pay | Admitting: Adult Health

## 2015-04-30 DIAGNOSIS — K5900 Constipation, unspecified: Secondary | ICD-10-CM

## 2015-04-30 DIAGNOSIS — F418 Other specified anxiety disorders: Secondary | ICD-10-CM

## 2015-04-30 DIAGNOSIS — G8190 Hemiplegia, unspecified affecting unspecified side: Secondary | ICD-10-CM

## 2015-04-30 DIAGNOSIS — R52 Pain, unspecified: Secondary | ICD-10-CM | POA: Diagnosis not present

## 2015-04-30 DIAGNOSIS — E46 Unspecified protein-calorie malnutrition: Secondary | ICD-10-CM | POA: Diagnosis not present

## 2015-04-30 DIAGNOSIS — F0281 Dementia in other diseases classified elsewhere with behavioral disturbance: Secondary | ICD-10-CM | POA: Diagnosis not present

## 2015-04-30 DIAGNOSIS — G40909 Epilepsy, unspecified, not intractable, without status epilepticus: Secondary | ICD-10-CM

## 2015-04-30 DIAGNOSIS — G8191 Hemiplegia, unspecified affecting right dominant side: Secondary | ICD-10-CM

## 2015-04-30 DIAGNOSIS — F02818 Dementia in other diseases classified elsewhere, unspecified severity, with other behavioral disturbance: Secondary | ICD-10-CM

## 2015-05-07 DIAGNOSIS — M79672 Pain in left foot: Secondary | ICD-10-CM | POA: Diagnosis not present

## 2015-05-07 DIAGNOSIS — M79671 Pain in right foot: Secondary | ICD-10-CM | POA: Diagnosis not present

## 2015-05-07 DIAGNOSIS — B351 Tinea unguium: Secondary | ICD-10-CM | POA: Diagnosis not present

## 2015-05-07 LAB — HM DIABETES FOOT EXAM

## 2015-05-20 DIAGNOSIS — M25621 Stiffness of right elbow, not elsewhere classified: Secondary | ICD-10-CM | POA: Diagnosis not present

## 2015-05-20 DIAGNOSIS — G8101 Flaccid hemiplegia affecting right dominant side: Secondary | ICD-10-CM | POA: Diagnosis not present

## 2015-05-20 DIAGNOSIS — R293 Abnormal posture: Secondary | ICD-10-CM | POA: Diagnosis not present

## 2015-05-20 DIAGNOSIS — G939 Disorder of brain, unspecified: Secondary | ICD-10-CM | POA: Diagnosis not present

## 2015-05-20 DIAGNOSIS — M24529 Contracture, unspecified elbow: Secondary | ICD-10-CM | POA: Diagnosis not present

## 2015-05-21 DIAGNOSIS — M24529 Contracture, unspecified elbow: Secondary | ICD-10-CM | POA: Diagnosis not present

## 2015-05-21 DIAGNOSIS — G8101 Flaccid hemiplegia affecting right dominant side: Secondary | ICD-10-CM | POA: Diagnosis not present

## 2015-05-21 DIAGNOSIS — M25621 Stiffness of right elbow, not elsewhere classified: Secondary | ICD-10-CM | POA: Diagnosis not present

## 2015-05-21 DIAGNOSIS — G939 Disorder of brain, unspecified: Secondary | ICD-10-CM | POA: Diagnosis not present

## 2015-05-21 DIAGNOSIS — R293 Abnormal posture: Secondary | ICD-10-CM | POA: Diagnosis not present

## 2015-05-24 DIAGNOSIS — M25621 Stiffness of right elbow, not elsewhere classified: Secondary | ICD-10-CM | POA: Diagnosis not present

## 2015-05-24 DIAGNOSIS — G8101 Flaccid hemiplegia affecting right dominant side: Secondary | ICD-10-CM | POA: Diagnosis not present

## 2015-05-24 DIAGNOSIS — M24529 Contracture, unspecified elbow: Secondary | ICD-10-CM | POA: Diagnosis not present

## 2015-05-24 DIAGNOSIS — R293 Abnormal posture: Secondary | ICD-10-CM | POA: Diagnosis not present

## 2015-05-24 DIAGNOSIS — G939 Disorder of brain, unspecified: Secondary | ICD-10-CM | POA: Diagnosis not present

## 2015-05-25 DIAGNOSIS — M25621 Stiffness of right elbow, not elsewhere classified: Secondary | ICD-10-CM | POA: Diagnosis not present

## 2015-05-25 DIAGNOSIS — R293 Abnormal posture: Secondary | ICD-10-CM | POA: Diagnosis not present

## 2015-05-25 DIAGNOSIS — M24529 Contracture, unspecified elbow: Secondary | ICD-10-CM | POA: Diagnosis not present

## 2015-05-25 DIAGNOSIS — G939 Disorder of brain, unspecified: Secondary | ICD-10-CM | POA: Diagnosis not present

## 2015-05-25 DIAGNOSIS — G8101 Flaccid hemiplegia affecting right dominant side: Secondary | ICD-10-CM | POA: Diagnosis not present

## 2015-05-26 DIAGNOSIS — R293 Abnormal posture: Secondary | ICD-10-CM | POA: Diagnosis not present

## 2015-05-26 DIAGNOSIS — M25621 Stiffness of right elbow, not elsewhere classified: Secondary | ICD-10-CM | POA: Diagnosis not present

## 2015-05-26 DIAGNOSIS — G8101 Flaccid hemiplegia affecting right dominant side: Secondary | ICD-10-CM | POA: Diagnosis not present

## 2015-05-26 DIAGNOSIS — G939 Disorder of brain, unspecified: Secondary | ICD-10-CM | POA: Diagnosis not present

## 2015-05-26 DIAGNOSIS — M24529 Contracture, unspecified elbow: Secondary | ICD-10-CM | POA: Diagnosis not present

## 2015-06-01 DIAGNOSIS — R293 Abnormal posture: Secondary | ICD-10-CM | POA: Diagnosis not present

## 2015-06-01 DIAGNOSIS — G8101 Flaccid hemiplegia affecting right dominant side: Secondary | ICD-10-CM | POA: Diagnosis not present

## 2015-06-01 DIAGNOSIS — G939 Disorder of brain, unspecified: Secondary | ICD-10-CM | POA: Diagnosis not present

## 2015-06-01 DIAGNOSIS — M24529 Contracture, unspecified elbow: Secondary | ICD-10-CM | POA: Diagnosis not present

## 2015-06-01 DIAGNOSIS — M25621 Stiffness of right elbow, not elsewhere classified: Secondary | ICD-10-CM | POA: Diagnosis not present

## 2015-06-03 DIAGNOSIS — M25621 Stiffness of right elbow, not elsewhere classified: Secondary | ICD-10-CM | POA: Diagnosis not present

## 2015-06-03 DIAGNOSIS — M24529 Contracture, unspecified elbow: Secondary | ICD-10-CM | POA: Diagnosis not present

## 2015-06-03 DIAGNOSIS — R293 Abnormal posture: Secondary | ICD-10-CM | POA: Diagnosis not present

## 2015-06-03 DIAGNOSIS — G939 Disorder of brain, unspecified: Secondary | ICD-10-CM | POA: Diagnosis not present

## 2015-06-03 DIAGNOSIS — G8101 Flaccid hemiplegia affecting right dominant side: Secondary | ICD-10-CM | POA: Diagnosis not present

## 2015-06-08 DIAGNOSIS — M24529 Contracture, unspecified elbow: Secondary | ICD-10-CM | POA: Diagnosis not present

## 2015-06-08 DIAGNOSIS — M25621 Stiffness of right elbow, not elsewhere classified: Secondary | ICD-10-CM | POA: Diagnosis not present

## 2015-06-08 DIAGNOSIS — G939 Disorder of brain, unspecified: Secondary | ICD-10-CM | POA: Diagnosis not present

## 2015-06-08 DIAGNOSIS — R293 Abnormal posture: Secondary | ICD-10-CM | POA: Diagnosis not present

## 2015-06-08 DIAGNOSIS — G8101 Flaccid hemiplegia affecting right dominant side: Secondary | ICD-10-CM | POA: Diagnosis not present

## 2015-06-10 DIAGNOSIS — G939 Disorder of brain, unspecified: Secondary | ICD-10-CM | POA: Diagnosis not present

## 2015-06-10 DIAGNOSIS — M24529 Contracture, unspecified elbow: Secondary | ICD-10-CM | POA: Diagnosis not present

## 2015-06-10 DIAGNOSIS — R293 Abnormal posture: Secondary | ICD-10-CM | POA: Diagnosis not present

## 2015-06-10 DIAGNOSIS — G8101 Flaccid hemiplegia affecting right dominant side: Secondary | ICD-10-CM | POA: Diagnosis not present

## 2015-06-10 DIAGNOSIS — M25621 Stiffness of right elbow, not elsewhere classified: Secondary | ICD-10-CM | POA: Diagnosis not present

## 2015-06-14 DIAGNOSIS — R293 Abnormal posture: Secondary | ICD-10-CM | POA: Diagnosis not present

## 2015-06-14 DIAGNOSIS — M25621 Stiffness of right elbow, not elsewhere classified: Secondary | ICD-10-CM | POA: Diagnosis not present

## 2015-06-14 DIAGNOSIS — R1312 Dysphagia, oropharyngeal phase: Secondary | ICD-10-CM | POA: Diagnosis not present

## 2015-06-14 DIAGNOSIS — G8101 Flaccid hemiplegia affecting right dominant side: Secondary | ICD-10-CM | POA: Diagnosis not present

## 2015-06-14 DIAGNOSIS — M24529 Contracture, unspecified elbow: Secondary | ICD-10-CM | POA: Diagnosis not present

## 2015-06-15 DIAGNOSIS — R1312 Dysphagia, oropharyngeal phase: Secondary | ICD-10-CM | POA: Diagnosis not present

## 2015-06-15 DIAGNOSIS — M24529 Contracture, unspecified elbow: Secondary | ICD-10-CM | POA: Diagnosis not present

## 2015-06-15 DIAGNOSIS — M25621 Stiffness of right elbow, not elsewhere classified: Secondary | ICD-10-CM | POA: Diagnosis not present

## 2015-06-15 DIAGNOSIS — G8101 Flaccid hemiplegia affecting right dominant side: Secondary | ICD-10-CM | POA: Diagnosis not present

## 2015-06-15 DIAGNOSIS — R293 Abnormal posture: Secondary | ICD-10-CM | POA: Diagnosis not present

## 2015-06-17 DIAGNOSIS — R1312 Dysphagia, oropharyngeal phase: Secondary | ICD-10-CM | POA: Diagnosis not present

## 2015-06-17 DIAGNOSIS — G8101 Flaccid hemiplegia affecting right dominant side: Secondary | ICD-10-CM | POA: Diagnosis not present

## 2015-06-17 DIAGNOSIS — R293 Abnormal posture: Secondary | ICD-10-CM | POA: Diagnosis not present

## 2015-06-17 DIAGNOSIS — M25621 Stiffness of right elbow, not elsewhere classified: Secondary | ICD-10-CM | POA: Diagnosis not present

## 2015-06-17 DIAGNOSIS — M24529 Contracture, unspecified elbow: Secondary | ICD-10-CM | POA: Diagnosis not present

## 2015-06-21 DIAGNOSIS — M24529 Contracture, unspecified elbow: Secondary | ICD-10-CM | POA: Diagnosis not present

## 2015-06-21 DIAGNOSIS — G8101 Flaccid hemiplegia affecting right dominant side: Secondary | ICD-10-CM | POA: Diagnosis not present

## 2015-06-21 DIAGNOSIS — R1312 Dysphagia, oropharyngeal phase: Secondary | ICD-10-CM | POA: Diagnosis not present

## 2015-06-21 DIAGNOSIS — R293 Abnormal posture: Secondary | ICD-10-CM | POA: Diagnosis not present

## 2015-06-21 DIAGNOSIS — M25621 Stiffness of right elbow, not elsewhere classified: Secondary | ICD-10-CM | POA: Diagnosis not present

## 2015-06-22 DIAGNOSIS — M25621 Stiffness of right elbow, not elsewhere classified: Secondary | ICD-10-CM | POA: Diagnosis not present

## 2015-06-22 DIAGNOSIS — R293 Abnormal posture: Secondary | ICD-10-CM | POA: Diagnosis not present

## 2015-06-22 DIAGNOSIS — R1312 Dysphagia, oropharyngeal phase: Secondary | ICD-10-CM | POA: Diagnosis not present

## 2015-06-22 DIAGNOSIS — G8101 Flaccid hemiplegia affecting right dominant side: Secondary | ICD-10-CM | POA: Diagnosis not present

## 2015-06-22 DIAGNOSIS — M24529 Contracture, unspecified elbow: Secondary | ICD-10-CM | POA: Diagnosis not present

## 2015-06-23 ENCOUNTER — Emergency Department (HOSPITAL_COMMUNITY): Payer: Medicare Other

## 2015-06-23 ENCOUNTER — Encounter (HOSPITAL_COMMUNITY): Payer: Self-pay | Admitting: Emergency Medicine

## 2015-06-23 ENCOUNTER — Emergency Department (HOSPITAL_COMMUNITY)
Admission: EM | Admit: 2015-06-23 | Discharge: 2015-06-24 | Disposition: A | Discharge status: Home / Self Care | Payer: Medicare Other | Attending: Emergency Medicine | Admitting: Emergency Medicine

## 2015-06-23 DIAGNOSIS — M25529 Pain in unspecified elbow: Secondary | ICD-10-CM | POA: Diagnosis not present

## 2015-06-23 DIAGNOSIS — G40909 Epilepsy, unspecified, not intractable, without status epilepticus: Secondary | ICD-10-CM | POA: Diagnosis not present

## 2015-06-23 DIAGNOSIS — Y998 Other external cause status: Secondary | ICD-10-CM | POA: Insufficient documentation

## 2015-06-23 DIAGNOSIS — F329 Major depressive disorder, single episode, unspecified: Secondary | ICD-10-CM | POA: Insufficient documentation

## 2015-06-23 DIAGNOSIS — Z79899 Other long term (current) drug therapy: Secondary | ICD-10-CM | POA: Diagnosis not present

## 2015-06-23 DIAGNOSIS — S42309A Unspecified fracture of shaft of humerus, unspecified arm, initial encounter for closed fracture: Secondary | ICD-10-CM | POA: Diagnosis not present

## 2015-06-23 DIAGNOSIS — S42351A Displaced comminuted fracture of shaft of humerus, right arm, initial encounter for closed fracture: Secondary | ICD-10-CM | POA: Insufficient documentation

## 2015-06-23 DIAGNOSIS — S42301A Unspecified fracture of shaft of humerus, right arm, initial encounter for closed fracture: Secondary | ICD-10-CM

## 2015-06-23 DIAGNOSIS — K5900 Constipation, unspecified: Secondary | ICD-10-CM | POA: Insufficient documentation

## 2015-06-23 DIAGNOSIS — F419 Anxiety disorder, unspecified: Secondary | ICD-10-CM | POA: Diagnosis not present

## 2015-06-23 DIAGNOSIS — Y9289 Other specified places as the place of occurrence of the external cause: Secondary | ICD-10-CM | POA: Insufficient documentation

## 2015-06-23 DIAGNOSIS — Y9389 Activity, other specified: Secondary | ICD-10-CM | POA: Insufficient documentation

## 2015-06-23 DIAGNOSIS — S4991XA Unspecified injury of right shoulder and upper arm, initial encounter: Secondary | ICD-10-CM | POA: Diagnosis present

## 2015-06-23 DIAGNOSIS — T148 Other injury of unspecified body region: Secondary | ICD-10-CM | POA: Diagnosis not present

## 2015-06-23 DIAGNOSIS — M79641 Pain in right hand: Secondary | ICD-10-CM | POA: Diagnosis not present

## 2015-06-23 DIAGNOSIS — W07XXXA Fall from chair, initial encounter: Secondary | ICD-10-CM | POA: Insufficient documentation

## 2015-06-23 DIAGNOSIS — W19XXXA Unspecified fall, initial encounter: Secondary | ICD-10-CM

## 2015-06-23 DIAGNOSIS — M25511 Pain in right shoulder: Secondary | ICD-10-CM | POA: Diagnosis not present

## 2015-06-23 DIAGNOSIS — S42211A Unspecified displaced fracture of surgical neck of right humerus, initial encounter for closed fracture: Secondary | ICD-10-CM | POA: Diagnosis not present

## 2015-06-23 DIAGNOSIS — M25519 Pain in unspecified shoulder: Secondary | ICD-10-CM | POA: Diagnosis not present

## 2015-06-23 DIAGNOSIS — M7989 Other specified soft tissue disorders: Secondary | ICD-10-CM | POA: Diagnosis not present

## 2015-06-23 DIAGNOSIS — M25539 Pain in unspecified wrist: Secondary | ICD-10-CM | POA: Diagnosis not present

## 2015-06-23 DIAGNOSIS — G939 Disorder of brain, unspecified: Secondary | ICD-10-CM | POA: Diagnosis not present

## 2015-06-23 HISTORY — DX: Dry eye syndrome of unspecified lacrimal gland: H04.129

## 2015-06-23 HISTORY — DX: Hemiplegia, unspecified affecting right dominant side: G81.91

## 2015-06-23 HISTORY — DX: Contracture, unspecified elbow: M24.529

## 2015-06-23 HISTORY — DX: Vascular dementia, unspecified severity, without behavioral disturbance, psychotic disturbance, mood disturbance, and anxiety: F01.50

## 2015-06-23 HISTORY — DX: Constipation, unspecified: K59.00

## 2015-06-23 MED ORDER — HYDROCODONE-ACETAMINOPHEN 5-325 MG PO TABS
1.0000 | ORAL_TABLET | Freq: Once | ORAL | Status: AC
  Administered 2015-06-23: 1 via ORAL
  Filled 2015-06-23: qty 1

## 2015-06-23 NOTE — ED Notes (Signed)
Per EMS [facility] is a [facility]  Pt is contracted on the right side and was sitting in the chair and was falling out catching his right arm in the chair rail which kept him from falling to the floor  Pt has been c/o shoulder pain since on the right side  Facility did an xray that showed an acute nondisplaced fracture surgical neck humerus

## 2015-06-23 NOTE — Progress Notes (Signed)
Patient listed as having Medicare insurance without a pcp.  [facility]  Patient's pcp at facility is Dr. Oneal GroutMahima Pandey.  System updated.

## 2015-06-23 NOTE — ED Notes (Signed)
PA at bedside.
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
## 2015-06-23 NOTE — ED Notes (Signed)
Returned from xray

## 2015-06-24 DIAGNOSIS — G8101 Flaccid hemiplegia affecting right dominant side: Secondary | ICD-10-CM | POA: Diagnosis not present

## 2015-06-24 DIAGNOSIS — M24529 Contracture, unspecified elbow: Secondary | ICD-10-CM | POA: Diagnosis not present

## 2015-06-24 DIAGNOSIS — R259 Unspecified abnormal involuntary movements: Secondary | ICD-10-CM | POA: Diagnosis not present

## 2015-06-24 DIAGNOSIS — R293 Abnormal posture: Secondary | ICD-10-CM | POA: Diagnosis not present

## 2015-06-24 DIAGNOSIS — M25621 Stiffness of right elbow, not elsewhere classified: Secondary | ICD-10-CM | POA: Diagnosis not present

## 2015-06-24 DIAGNOSIS — R1312 Dysphagia, oropharyngeal phase: Secondary | ICD-10-CM | POA: Diagnosis not present

## 2015-06-24 NOTE — ED Notes (Signed)
[facility] called for transport back to facility 

## 2015-06-24 NOTE — Discharge Instructions (Signed)
-   Call Dr. Magnus IvanBlackman (orthopedics) office tomorrow to schedule a follow up appointment for right humeral fracture Humerus Fracture Treated With Immobilization The humerus is the large bone in your upper arm. You have a broken (fractured) humerus. These fractures are easily diagnosed with X-rays. TREATMENT  Simple fractures which will heal without disability are treated with simple immobilization. Immobilization means you will wear a cast, splint, or sling. You have a fracture which will do well with immobilization. The fracture will heal well simply by being held in a good position until it is stable enough to begin range of motion exercises. Do not take part in activities which would further injure your arm.  HOME CARE INSTRUCTIONS   Put ice on the injured area.  Put ice in a plastic bag.  Place a towel between your skin and the bag.  Leave the ice on for 15-20 minutes, 03-04 times a day.  If you have a cast:  Do not scratch the skin under the cast using sharp or pointed objects.  Check the skin around the cast every day. You may put lotion on any red or sore areas.  Keep your cast dry and clean.  If you have a splint:  Wear the splint as directed.  Keep your splint dry and clean.  You may loosen the elastic around the splint if your fingers become numb, tingle, or turn cold or blue.  If you have a sling:  Wear the sling as directed.  Do not put pressure on any part of your cast or splint until it is fully hardened.  Your cast or splint can be protected during bathing with a plastic bag. Do not lower the cast or splint into water.  Only take over-the-counter or prescription medicines for pain, discomfort, or fever as directed by your caregiver.  Do range of motion exercises as instructed by your caregiver.  Follow up as directed by your caregiver. This is very important in order to avoid permanent injury or disability and chronic pain. SEEK IMMEDIATE MEDICAL CARE IF:    Your skin or nails in the injured arm turn blue or gray.  Your arm feels cold or numb.  You develop severe pain in the injured arm.  You are having problems with the medicines you were given. MAKE SURE YOU:   Understand these instructions.  Will watch your condition.  Will get help right away if you are not doing well or get worse.   This information is not intended to replace advice given to you by your health care provider. Make sure you discuss any questions you have with your health care provider.   Document Released: 12/04/2000 Document Revised: 09/18/2014 Document Reviewed: 01/20/2015 Elsevier Interactive Patient Education 2016 [employer]  - Keep Joshua Munoz's arm in his sling

## 2015-06-25 DIAGNOSIS — G8101 Flaccid hemiplegia affecting right dominant side: Secondary | ICD-10-CM | POA: Diagnosis not present

## 2015-06-25 DIAGNOSIS — J189 Pneumonia, unspecified organism: Secondary | ICD-10-CM | POA: Diagnosis not present

## 2015-06-25 DIAGNOSIS — M25621 Stiffness of right elbow, not elsewhere classified: Secondary | ICD-10-CM | POA: Diagnosis not present

## 2015-06-25 DIAGNOSIS — R293 Abnormal posture: Secondary | ICD-10-CM | POA: Diagnosis not present

## 2015-06-25 DIAGNOSIS — M24529 Contracture, unspecified elbow: Secondary | ICD-10-CM | POA: Diagnosis not present

## 2015-06-25 DIAGNOSIS — R1312 Dysphagia, oropharyngeal phase: Secondary | ICD-10-CM | POA: Diagnosis not present

## 2015-06-28 DIAGNOSIS — S42301A Unspecified fracture of shaft of humerus, right arm, initial encounter for closed fracture: Secondary | ICD-10-CM | POA: Diagnosis not present

## 2015-06-28 DIAGNOSIS — S42291A Other displaced fracture of upper end of right humerus, initial encounter for closed fracture: Secondary | ICD-10-CM | POA: Diagnosis not present

## 2015-06-28 DIAGNOSIS — M24529 Contracture, unspecified elbow: Secondary | ICD-10-CM | POA: Diagnosis not present

## 2015-06-28 DIAGNOSIS — G8101 Flaccid hemiplegia affecting right dominant side: Secondary | ICD-10-CM | POA: Diagnosis not present

## 2015-06-28 DIAGNOSIS — R293 Abnormal posture: Secondary | ICD-10-CM | POA: Diagnosis not present

## 2015-06-28 DIAGNOSIS — M25621 Stiffness of right elbow, not elsewhere classified: Secondary | ICD-10-CM | POA: Diagnosis not present

## 2015-06-28 DIAGNOSIS — R1312 Dysphagia, oropharyngeal phase: Secondary | ICD-10-CM | POA: Diagnosis not present

## 2015-06-29 DIAGNOSIS — H47293 Other optic atrophy, bilateral: Secondary | ICD-10-CM | POA: Diagnosis not present

## 2015-06-29 DIAGNOSIS — M24529 Contracture, unspecified elbow: Secondary | ICD-10-CM | POA: Diagnosis not present

## 2015-06-29 DIAGNOSIS — H04123 Dry eye syndrome of bilateral lacrimal glands: Secondary | ICD-10-CM | POA: Diagnosis not present

## 2015-06-29 DIAGNOSIS — G8101 Flaccid hemiplegia affecting right dominant side: Secondary | ICD-10-CM | POA: Diagnosis not present

## 2015-06-29 DIAGNOSIS — H2513 Age-related nuclear cataract, bilateral: Secondary | ICD-10-CM | POA: Diagnosis not present

## 2015-06-29 DIAGNOSIS — R1312 Dysphagia, oropharyngeal phase: Secondary | ICD-10-CM | POA: Diagnosis not present

## 2015-06-29 DIAGNOSIS — R293 Abnormal posture: Secondary | ICD-10-CM | POA: Diagnosis not present

## 2015-06-29 DIAGNOSIS — M25621 Stiffness of right elbow, not elsewhere classified: Secondary | ICD-10-CM | POA: Diagnosis not present

## 2015-06-30 DIAGNOSIS — R293 Abnormal posture: Secondary | ICD-10-CM | POA: Diagnosis not present

## 2015-06-30 DIAGNOSIS — R1312 Dysphagia, oropharyngeal phase: Secondary | ICD-10-CM | POA: Diagnosis not present

## 2015-06-30 DIAGNOSIS — M25621 Stiffness of right elbow, not elsewhere classified: Secondary | ICD-10-CM | POA: Diagnosis not present

## 2015-06-30 DIAGNOSIS — G8101 Flaccid hemiplegia affecting right dominant side: Secondary | ICD-10-CM | POA: Diagnosis not present

## 2015-06-30 DIAGNOSIS — M24529 Contracture, unspecified elbow: Secondary | ICD-10-CM | POA: Diagnosis not present

## 2015-07-01 DIAGNOSIS — M25621 Stiffness of right elbow, not elsewhere classified: Secondary | ICD-10-CM | POA: Diagnosis not present

## 2015-07-01 DIAGNOSIS — R1312 Dysphagia, oropharyngeal phase: Secondary | ICD-10-CM | POA: Diagnosis not present

## 2015-07-01 DIAGNOSIS — M24529 Contracture, unspecified elbow: Secondary | ICD-10-CM | POA: Diagnosis not present

## 2015-07-01 DIAGNOSIS — R293 Abnormal posture: Secondary | ICD-10-CM | POA: Diagnosis not present

## 2015-07-01 DIAGNOSIS — G8101 Flaccid hemiplegia affecting right dominant side: Secondary | ICD-10-CM | POA: Diagnosis not present

## 2015-07-06 DIAGNOSIS — R293 Abnormal posture: Secondary | ICD-10-CM | POA: Diagnosis not present

## 2015-07-06 DIAGNOSIS — M24529 Contracture, unspecified elbow: Secondary | ICD-10-CM | POA: Diagnosis not present

## 2015-07-06 DIAGNOSIS — G8101 Flaccid hemiplegia affecting right dominant side: Secondary | ICD-10-CM | POA: Diagnosis not present

## 2015-07-06 DIAGNOSIS — R1312 Dysphagia, oropharyngeal phase: Secondary | ICD-10-CM | POA: Diagnosis not present

## 2015-07-06 DIAGNOSIS — M25621 Stiffness of right elbow, not elsewhere classified: Secondary | ICD-10-CM | POA: Diagnosis not present

## 2015-07-07 DIAGNOSIS — M25621 Stiffness of right elbow, not elsewhere classified: Secondary | ICD-10-CM | POA: Diagnosis not present

## 2015-07-07 DIAGNOSIS — R1312 Dysphagia, oropharyngeal phase: Secondary | ICD-10-CM | POA: Diagnosis not present

## 2015-07-07 DIAGNOSIS — R293 Abnormal posture: Secondary | ICD-10-CM | POA: Diagnosis not present

## 2015-07-07 DIAGNOSIS — M24529 Contracture, unspecified elbow: Secondary | ICD-10-CM | POA: Diagnosis not present

## 2015-07-07 DIAGNOSIS — G8101 Flaccid hemiplegia affecting right dominant side: Secondary | ICD-10-CM | POA: Diagnosis not present

## 2015-07-08 DIAGNOSIS — R293 Abnormal posture: Secondary | ICD-10-CM | POA: Diagnosis not present

## 2015-07-08 DIAGNOSIS — R1312 Dysphagia, oropharyngeal phase: Secondary | ICD-10-CM | POA: Diagnosis not present

## 2015-07-08 DIAGNOSIS — G8101 Flaccid hemiplegia affecting right dominant side: Secondary | ICD-10-CM | POA: Diagnosis not present

## 2015-07-08 DIAGNOSIS — M24529 Contracture, unspecified elbow: Secondary | ICD-10-CM | POA: Diagnosis not present

## 2015-07-08 DIAGNOSIS — M25621 Stiffness of right elbow, not elsewhere classified: Secondary | ICD-10-CM | POA: Diagnosis not present

## 2015-07-12 NOTE — Progress Notes (Signed)
Patient ID: Joshua Munoz, male   DOB: 06-21-1951, 64 y.o.   MRN: [id number]   04/30/15  Facility:  Nursing Home Location:  [facility] Room Number: 404-1 LEVEL OF CARE:  SNF (31)   Chief Complaint  Patient presents with  . Medical Management of Chronic Issues    Seizure, constipation, dementia, depression, generalized pain, anxiety and seasonal allergy    HISTORY OF PRESENT ILLNESS:  This is a 64 year old male who is being seen on a routine visit.  He has right hemiplegia and requires assistance with all ADLs and mobility. His generalized pain is well controlled with Norco. His mood has been stable. He currently takes Ativan for anxiety. Celexa was recently decreased to 10 mg by mouth daily  Per pharmacy [facility]. No seizure episode has been reported/noted.  PAST MEDICAL HISTORY:  Past Medical History  Diagnosis Date  . Anxiety   . Seizures (HCC)   . Depression   . Dysphagia   . Cerebral trauma (HCC)   . Hemiplegia affecting right dominant side (HCC)   . Dry eye syndrome   . Constipation   . Contracture of elbow   . Vascular dementia without behavioral disturbance     CURRENT MEDICATIONS: Reviewed per MAR/see medication list   Medication List       This list is accurate as of: 04/30/15 11:59 PM.  Always use your most recent med list.               bisacodyl 10 MG suppository  Commonly known as:  DULCOLAX  Place 10 mg rectally at bedtime as needed (Q other night).     carbamazepine 200 MG tablet  Commonly known as:  TEGRETOL  Take 200 mg by mouth 2 (two) times daily. Take 1 1/2 tab = 300 mg @ 9AM, 1pm and 9pm PO  for seizure.     CERTAVITE/ANTIOXIDANTS PO  Take by mouth daily.     citalopram 20 MG tablet  Commonly known as:  CELEXA  Take by mouth daily. Take 1 tablet by mouth daily for depression     CLARITIN 10 MG tablet  Generic drug:  loratadine  Take 10 mg by mouth daily. As needed     HYDROcodone-acetaminophen 5-325 MG tablet   Commonly known as:  NORCO/VICODIN  Take one tablet by mouth once daily for pain. Do not exceed 4gm of Tylenol in 24 hours     LORazepam 0.5 MG tablet  Commonly known as:  ATIVAN  Take 1/2 tablet by mouth once daily for anxiety     PHENobarbital 97.2 MG tablet  Commonly known as:  LUMINAL  Take 1 tablet (97.2 mg total) by mouth at bedtime.     PROCEL Powd  Take by mouth. 285 gram can, 1 scoop twice daily by mouth for nutritional support and to increase protein     sennosides-docusate sodium 8.6-50 MG tablet  Commonly known as:  SENOKOT-S  Take 3 tablets by mouth 2 (two) times daily. For constipation     SYSTANE 0.4-0.3 % Soln  Generic drug:  Polyethyl Glycol-Propyl Glycol  Apply 1 drop to eye 4 (four) times daily. Left eye         No Known Allergies   REVIEW OF SYSTEMS:  Unobtainable due to advanced dementia  PHYSICAL EXAMINATION  GENERAL: no acute distress, normal body habitus EYES: conjunctivae normal, sclerae normal, normal eye lids NECK: supple, trachea midline, no neck masses, no thyroid tenderness, no  thyromegaly LYMPHATICS: no LAN in the neck, no supraclavicular LAN RESPIRATORY: breathing is even & unlabored, BS CTAB CARDIAC: RRR, no murmur,no extra heart sounds, no edema GI: abdomen soft, normal BS, no masses, no tenderness, no hepatomegaly, no splenomegaly EXTREMITIES:  Right hemiplegia; LUE full ROM, 5/5 strength; RUE contracted; BLE no movement; uses geri-chair PSYCHIATRIC: the patient is alert & oriented to person, affect & behavior appropriate  LABS/RADIOLOGY: 03/24/15  carbamazepine 6.0 02/03/15  WBC 5.6 hemoglobin 12.8 hematocrit 37.5 MCV 92.6 platelet 231 total bilirubin 0.4 direct bilirubin 0.1 in direct bilirubin 0.3 alkaline phosphatase 86 SGOT 11 SGPT 11 total protein 5.3 albumin 3.0 carbamazepine 6.7 phenobarbital 20.5 01/08/15  WBC 6.7 hemoglobin 13.7 hematocrit 40.7 MCV 95.1 platelet count 216 sodium 135 potassium 4.3 glucose 85 BUN 8 creatinine 0.66  total bilirubin 0.5 alkaline phosphatase 92 SGOT 16 SGPT 18 total protein 5.2 albumin 3.3 calcium 8.2 carbamazepine 4.5 10/15/14  sodium 133 potassium 4.3 glucose 75 BUN 10 creatinine 0.63 calcium 8.5 08/19/14  WBC 5.6 hemoglobin 13.9 hematocrit 42.4 MCV 98.4 total protein 5.4 albumin 3.4 total bilirubin 0.5 direct bilirubin 0.1 ALP 92 AST 11 ALT 11, Carbamazepine, total 9.6 phenobarbital 18.7 05/27/14  PSA 0.5 03/05/14 phenobarbital 18.2 WBC 5.3 hemoglobin 13.6 hematocrit 43.2 total protein 5.3 albumin 3.2 globulin 2.1 total bilirubin 0.4 direct bilirubin 0.1 ALP 91 AST 12 ALT 12 Tegretol 8.380 4/14 sodium 136 potassium 4.0 glucose 91 BUN 9 creatinine 0.7 calcium 8.5 total protein 5.8 albumin 3.5 GLOB 2.3 AST 13 ALT 12 12-14 Tegretol level her 1.3, CBC normal, total protein 5.6 otherwise liver profile normal,  phenobarbital 18.5 8-14 BMP normal 6-14 CBC normal, total protein 5.7 otherwise liver profile normal, phenobarbital 16.2 12/13 CBC normal, total protein 5.4, albumin 3.3 otherwise CMP normal, hemoglobin A1c 5.4, Tegretol level 7.3, phenobarbital level 18.4   ASSESSMENT/PLAN:  Seizure - continue Tegretol Constipation - continue senna S2 tabs by mouth twice a day and Dulcolax suppository rectally every other HS Dementia - advanced Depression - mood is stable; continue Celexa 10 mg by mouth daily Generalized pain - continue Norco 5/325 mg by mouth daily Anxiety - continue Ativan 0.25 mg by mouth daily Right Hemiplegia -  requires assistance with ADLs and mobility; continue supportive care Protein calorie malnutrition - albumin 3.0; continue supplementation     Goals of care:  Long-term care    [facility], NP [facility] [phone]

## 2015-07-13 DIAGNOSIS — R130 Aphagia: Secondary | ICD-10-CM | POA: Diagnosis not present

## 2015-07-13 DIAGNOSIS — M25621 Stiffness of right elbow, not elsewhere classified: Secondary | ICD-10-CM | POA: Diagnosis not present

## 2015-07-13 DIAGNOSIS — M24529 Contracture, unspecified elbow: Secondary | ICD-10-CM | POA: Diagnosis not present

## 2015-07-13 DIAGNOSIS — G8101 Flaccid hemiplegia affecting right dominant side: Secondary | ICD-10-CM | POA: Diagnosis not present

## 2015-07-13 DIAGNOSIS — R1312 Dysphagia, oropharyngeal phase: Secondary | ICD-10-CM | POA: Diagnosis not present

## 2015-07-20 DIAGNOSIS — M24529 Contracture, unspecified elbow: Secondary | ICD-10-CM | POA: Diagnosis not present

## 2015-07-20 DIAGNOSIS — R1312 Dysphagia, oropharyngeal phase: Secondary | ICD-10-CM | POA: Diagnosis not present

## 2015-07-20 DIAGNOSIS — M25621 Stiffness of right elbow, not elsewhere classified: Secondary | ICD-10-CM | POA: Diagnosis not present

## 2015-07-20 DIAGNOSIS — G8101 Flaccid hemiplegia affecting right dominant side: Secondary | ICD-10-CM | POA: Diagnosis not present

## 2015-07-20 DIAGNOSIS — R130 Aphagia: Secondary | ICD-10-CM | POA: Diagnosis not present

## 2015-07-22 DIAGNOSIS — M24529 Contracture, unspecified elbow: Secondary | ICD-10-CM | POA: Diagnosis not present

## 2015-07-22 DIAGNOSIS — M25621 Stiffness of right elbow, not elsewhere classified: Secondary | ICD-10-CM | POA: Diagnosis not present

## 2015-07-22 DIAGNOSIS — R130 Aphagia: Secondary | ICD-10-CM | POA: Diagnosis not present

## 2015-07-22 DIAGNOSIS — R1312 Dysphagia, oropharyngeal phase: Secondary | ICD-10-CM | POA: Diagnosis not present

## 2015-07-22 DIAGNOSIS — G8101 Flaccid hemiplegia affecting right dominant side: Secondary | ICD-10-CM | POA: Diagnosis not present

## 2015-07-23 DIAGNOSIS — M79672 Pain in left foot: Secondary | ICD-10-CM | POA: Diagnosis not present

## 2015-07-23 DIAGNOSIS — M79671 Pain in right foot: Secondary | ICD-10-CM | POA: Diagnosis not present

## 2015-07-23 DIAGNOSIS — B351 Tinea unguium: Secondary | ICD-10-CM | POA: Diagnosis not present

## 2015-07-23 DIAGNOSIS — G8190 Hemiplegia, unspecified affecting unspecified side: Secondary | ICD-10-CM | POA: Diagnosis not present

## 2015-07-26 DIAGNOSIS — S42301A Unspecified fracture of shaft of humerus, right arm, initial encounter for closed fracture: Secondary | ICD-10-CM | POA: Diagnosis not present

## 2015-07-27 DIAGNOSIS — M24529 Contracture, unspecified elbow: Secondary | ICD-10-CM | POA: Diagnosis not present

## 2015-07-27 DIAGNOSIS — G8101 Flaccid hemiplegia affecting right dominant side: Secondary | ICD-10-CM | POA: Diagnosis not present

## 2015-07-27 DIAGNOSIS — R1312 Dysphagia, oropharyngeal phase: Secondary | ICD-10-CM | POA: Diagnosis not present

## 2015-07-27 DIAGNOSIS — M25621 Stiffness of right elbow, not elsewhere classified: Secondary | ICD-10-CM | POA: Diagnosis not present

## 2015-07-27 DIAGNOSIS — R130 Aphagia: Secondary | ICD-10-CM | POA: Diagnosis not present

## 2015-08-20 ENCOUNTER — Other Ambulatory Visit: Payer: Self-pay | Admitting: *Deleted

## 2015-08-20 MED ORDER — PHENOBARBITAL 97.2 MG PO TABS: 97.2000 mg | ORAL_TABLET | Freq: Every day | ORAL | Status: DC

## 2015-08-20 NOTE — Telephone Encounter (Signed)
[facility] 

## 2015-08-25 ENCOUNTER — Non-Acute Institutional Stay (SKILLED_NURSING_FACILITY): Payer: Medicare Other | Admitting: Adult Health

## 2015-08-25 DIAGNOSIS — K5900 Constipation, unspecified: Secondary | ICD-10-CM | POA: Diagnosis not present

## 2015-08-25 DIAGNOSIS — G40909 Epilepsy, unspecified, not intractable, without status epilepticus: Secondary | ICD-10-CM | POA: Diagnosis not present

## 2015-08-25 DIAGNOSIS — F0281 Dementia in other diseases classified elsewhere with behavioral disturbance: Secondary | ICD-10-CM

## 2015-08-25 DIAGNOSIS — S42301A Unspecified fracture of shaft of humerus, right arm, initial encounter for closed fracture: Secondary | ICD-10-CM | POA: Diagnosis not present

## 2015-08-25 DIAGNOSIS — S42301S Unspecified fracture of shaft of humerus, right arm, sequela: Secondary | ICD-10-CM

## 2015-08-25 DIAGNOSIS — F329 Major depressive disorder, single episode, unspecified: Secondary | ICD-10-CM | POA: Diagnosis not present

## 2015-08-25 DIAGNOSIS — G8191 Hemiplegia, unspecified affecting right dominant side: Secondary | ICD-10-CM

## 2015-08-25 DIAGNOSIS — E46 Unspecified protein-calorie malnutrition: Secondary | ICD-10-CM | POA: Diagnosis not present

## 2015-08-25 DIAGNOSIS — F32A Depression, unspecified: Secondary | ICD-10-CM

## 2015-08-25 DIAGNOSIS — R52 Pain, unspecified: Secondary | ICD-10-CM | POA: Diagnosis not present

## 2015-08-25 DIAGNOSIS — F419 Anxiety disorder, unspecified: Secondary | ICD-10-CM

## 2015-08-25 DIAGNOSIS — F02818 Dementia in other diseases classified elsewhere, unspecified severity, with other behavioral disturbance: Secondary | ICD-10-CM

## 2015-08-27 DIAGNOSIS — G40901 Epilepsy, unspecified, not intractable, with status epilepticus: Secondary | ICD-10-CM | POA: Diagnosis not present

## 2015-08-27 DIAGNOSIS — G939 Disorder of brain, unspecified: Secondary | ICD-10-CM | POA: Diagnosis not present

## 2015-08-27 LAB — HEPATIC FUNCTION PANEL
ALT: 16 U/L (ref 10–40)
AST: 16 U/L (ref 14–40)
Alkaline Phosphatase: 119 U/L (ref 25–125)
BILIRUBIN, TOTAL: 0.4 mg/dL
Bilirubin, Total: 0.4 mg/dL

## 2015-08-27 LAB — BASIC METABOLIC PANEL WITH GFR
BUN: 12 mg/dL (ref 4–21)
Creatinine: 0.7 mg/dL (ref 0.6–1.3)
Glucose: 86 mg/dL
Potassium: 4.5 mmol/L (ref 3.4–5.3)
Sodium: 138 mmol/L (ref 137–147)

## 2015-08-27 LAB — CBC AND DIFFERENTIAL
HCT: 43 % (ref 41–53)
Hemoglobin: 14 g/dL (ref 13.5–17.5)
Platelets: 191 10*3/uL (ref 150–399)
WBC: 5.5 10*3/mL

## 2015-09-06 ENCOUNTER — Encounter: Payer: Self-pay | Admitting: Adult Health

## 2015-09-06 NOTE — Progress Notes (Signed)
Patient ID: Joshua Munoz, male   DOB: [birth year], 64 y.o.   MRN: [id number]   DATE:   08/25/15  Facility:  Nursing Home Location:  [facility] Room Number: [id number] LEVEL OF CARE:  SNF (31)   Chief Complaint  Patient presents with  . Medical Management of Chronic Issues    HISTORY OF PRESENT ILLNESS:  This is a 64 year old male who is being seen on a routine visit.  He has right hemiplegia and requires assistance with all ADLs and mobility. He had a recent fall and sustained a right humeral fracture.   PAST MEDICAL HISTORY:  Past Medical History  Diagnosis Date  . Anxiety   . Seizures (HCC)   . Depression   . Dysphagia   . Cerebral trauma (HCC)   . Hemiplegia affecting right dominant side (HCC)   . Dry eye syndrome   . Constipation   . Contracture of elbow   . Vascular dementia without behavioral disturbance     CURRENT MEDICATIONS: Reviewed per MAR/see medication list   Medication List       This list is accurate as of: 08/25/15 11:59 PM.  Always use your most recent med list.               bisacodyl 10 MG suppository  Commonly known as:  DULCOLAX  Place 10 mg rectally at bedtime as needed (Q other night).     carbamazepine 200 MG tablet  Commonly known as:  TEGRETOL  Take 200 mg by mouth 2 (two) times daily. Take 1 1/2 tab = 300 mg @ 9AM, 1pm and 9pm PO  for seizure.     CERTAVITE/ANTIOXIDANTS PO  Take by mouth daily.     citalopram 10 MG tablet  Commonly known as:  CELEXA  Take 1 tablet by mouth daily.     CLARITIN 10 MG tablet  Generic drug:  loratadine  Take 10 mg by mouth daily. As needed     HYDROcodone-acetaminophen 5-325 MG tablet  Commonly known as:  NORCO/VICODIN  Take one tablet by mouth once daily for pain. Do not exceed 4gm of Tylenol in 24 hours     LORazepam 0.5 MG tablet  Commonly known as:  ATIVAN  Take 1/2 tablet by mouth once daily for anxiety     PHENobarbital 97.2 MG tablet  Commonly known as:   LUMINAL  Take 1 tablet (97.2 mg total) by mouth at bedtime.     PROCEL Powd  Take by mouth. 285 gram can, 1 scoop twice daily by mouth for nutritional support and to increase protein     sennosides-docusate sodium 8.6-50 MG tablet  Commonly known as:  SENOKOT-S  Take 3 tablets by mouth 2 (two) times daily. For constipation     SYSTANE 0.4-0.3 % Soln  Generic drug:  Polyethyl Glycol-Propyl Glycol  Apply 1 drop to eye 4 (four) times daily. Left eye         No Known Allergies   REVIEW OF SYSTEMS:  Unobtainable due to advanced dementia  PHYSICAL EXAMINATION  GENERAL: no acute distress, normal body habitus EYES: conjunctivae normal, sclerae normal, normal eye lids NECK: supple, trachea midline, no neck masses, no thyroid tenderness, no thyromegaly LYMPHATICS: no LAN in the neck, no supraclavicular LAN RESPIRATORY: breathing is even & unlabored, BS CTAB CARDIAC: RRR, no murmur,no extra heart sounds, no edema GI: abdomen soft, normal BS, no masses, no tenderness, no hepatomegaly, no splenomegaly EXTREMITIES:  Right  hemiplegia; LUE full ROM, 5/5 strength; RUE contracted; BLE no movement; uses geri-chair; has splint on RUE PSYCHIATRIC: the patient is alert & oriented to person, affect & behavior appropriate  LABS/RADIOLOGY: 06/25/15  Chest x-ray shows nondisplaced fracture age indeterminate right upper Humeral shaft 06/23/15  right shoulder x-ray shows  Surgical neck humerus 03/24/15  carbamazepine 6.0 02/03/15  WBC 5.6 hemoglobin 12.8 hematocrit 37.5 MCV 92.6 platelet 231 total bilirubin 0.4 direct bilirubin 0.1 in direct bilirubin 0.3 alkaline phosphatase 86 SGOT 11 SGPT 11 total protein 5.3 albumin 3.0 carbamazepine 6.7 phenobarbital 20.5 01/08/15  WBC 6.7 hemoglobin 13.7 hematocrit 40.7 MCV 95.1 platelet count 216 sodium 135 potassium 4.3 glucose 85 BUN 8 creatinine 0.66 total bilirubin 0.5 alkaline phosphatase 92 SGOT 16 SGPT 18 total protein 5.2 albumin 3.3 calcium 8.2 carbamazepine  4.5 10/15/14  sodium 133 potassium 4.3 glucose 75 BUN 10 creatinine 0.63 calcium 8.5 08/19/14  WBC 5.6 hemoglobin 13.9 hematocrit 42.4 MCV 98.4 total protein 5.4 albumin 3.4 total bilirubin 0.5 direct bilirubin 0.1 ALP 92 AST 11 ALT 11, Carbamazepine, total 9.6 phenobarbital 18.7 05/27/14  PSA 0.5 03/05/14 phenobarbital 18.2 WBC 5.3 hemoglobin 13.6 hematocrit 43.2 total protein 5.3 albumin 3.2 globulin 2.1 total bilirubin 0.4 direct bilirubin 0.1 ALP 91 AST 12 ALT 12 Tegretol 8.380 4/14 sodium 136 potassium 4.0 glucose 91 BUN 9 creatinine 0.7 calcium 8.5 total protein 5.8 albumin 3.5 GLOB 2.3 AST 13 ALT 12 12-14 Tegretol level her 1.3, CBC normal, total protein 5.6 otherwise liver profile normal,  phenobarbital 18.5 8-14 BMP normal 6-14 CBC normal, total protein 5.7 otherwise liver profile normal, phenobarbital 16.2 12/13 CBC normal, total protein 5.4, albumin 3.3 otherwise CMP normal, hemoglobin A1c 5.4, Tegretol level 7.3, phenobarbital level 18.4   ASSESSMENT/PLAN:  Seizure - continue Tegretol 300 mg 4 times a day and phenobarbital 97.2 mg daily at bedtime  Constipation - continue senna S2 tabs by mouth twice a day and Dulcolax suppository rectally every other HS  Dementia - advanced  Depression - mood is stable; continue Celexa 10 mg by mouth daily  Generalized pain - continue Norco 5/325 mg by mouth daily  Anxiety - continue Ativan 0.25 mg by mouth daily  Right Hemiplegia -  requires assistance with ADLs and mobility; continue supportive care  Protein calorie malnutrition - albumin 3.0; continue Procel 1 scoop by mouth twice a day; check CBC and CMP  Right humeral fracture - continue splint to RUE and Tylenol 325 mg 2 tabs = 650 mg by mouth every 6 hours  when necessary   Goals of care:  Long-term care    [facility], NP [facility] [phone]

## 2015-09-06 NOTE — Progress Notes (Signed)
This encounter was created in error - please disregard.

## 2015-10-06 ENCOUNTER — Encounter: Payer: Self-pay | Admitting: Adult Health

## 2015-10-06 ENCOUNTER — Non-Acute Institutional Stay (SKILLED_NURSING_FACILITY): Payer: Medicare Other | Admitting: Adult Health

## 2015-10-06 DIAGNOSIS — G40909 Epilepsy, unspecified, not intractable, without status epilepticus: Secondary | ICD-10-CM

## 2015-10-06 DIAGNOSIS — F419 Anxiety disorder, unspecified: Secondary | ICD-10-CM

## 2015-10-06 DIAGNOSIS — G8191 Hemiplegia, unspecified affecting right dominant side: Secondary | ICD-10-CM

## 2015-10-06 DIAGNOSIS — R52 Pain, unspecified: Secondary | ICD-10-CM | POA: Diagnosis not present

## 2015-10-06 DIAGNOSIS — E46 Unspecified protein-calorie malnutrition: Secondary | ICD-10-CM | POA: Diagnosis not present

## 2015-10-06 DIAGNOSIS — F32A Depression, unspecified: Secondary | ICD-10-CM

## 2015-10-06 DIAGNOSIS — F329 Major depressive disorder, single episode, unspecified: Secondary | ICD-10-CM | POA: Diagnosis not present

## 2015-10-06 DIAGNOSIS — F02818 Dementia in other diseases classified elsewhere, unspecified severity, with other behavioral disturbance: Secondary | ICD-10-CM

## 2015-10-06 DIAGNOSIS — K5900 Constipation, unspecified: Secondary | ICD-10-CM

## 2015-10-06 DIAGNOSIS — F0281 Dementia in other diseases classified elsewhere with behavioral disturbance: Secondary | ICD-10-CM | POA: Diagnosis not present

## 2015-10-06 NOTE — Progress Notes (Signed)
Patient ID: Joshua Munoz, male   DOB: 1951/05/11, 65 y.o.   MRN: [id number]   DATE:   10/06/15  Facility:  Nursing Home Location:  [facility] Room Number: [id number] LEVEL OF CARE:  SNF (31)   Chief Complaint  Patient presents with  . Medical Management of Chronic Issues    Depression, right hemiplegia, constipation, seizure, dementia, generalized pain, anxiety and protein calorie malnutrition    HISTORY OF PRESENT ILLNESS:  This is a 65 year old male who is being seen on a routine visit.  He has right hemiplegia and requires assistance with all ADLs and mobility. He has been stable for the past month.   PAST MEDICAL HISTORY:  Past Medical History  Diagnosis Date  . Anxiety   . Seizures (HCC)   . Depression   . Dysphagia   . Cerebral trauma (HCC)   . Hemiplegia affecting right dominant side (HCC)   . Dry eye syndrome   . Constipation   . Contracture of elbow   . Vascular dementia without behavioral disturbance   . Dementia   . Generalized pain   . Protein calorie malnutrition (HCC)   . Right humeral fracture     CURRENT MEDICATIONS: Reviewed per MAR/see medication list   Medication List       This list is accurate as of: 10/06/15 11:59 PM.  Always use your most recent med list.               acetaminophen 325 MG tablet  Commonly known as:  TYLENOL  Take by mouth every 6 (six) hours as needed for mild pain.     bisacodyl 10 MG suppository  Commonly known as:  DULCOLAX  Place 10 mg rectally at bedtime as needed (Q other night).     carbamazepine 200 MG tablet  Commonly known as:  TEGRETOL  Take 200 mg by mouth 4 (four) times daily. Take 1 1/2 tab = 300 mg     CERTAVITE/ANTIOXIDANTS PO  Take by mouth daily.     citalopram 10 MG tablet  Commonly known as:  CELEXA  Take 1 tablet by mouth daily.     HYDROcodone-acetaminophen 5-325 MG tablet  Commonly known as:  NORCO/VICODIN  Take one tablet by mouth once daily for pain. Do  not exceed 4gm of Tylenol in 24 hours     LORazepam 0.5 MG tablet  Commonly known as:  ATIVAN  Take 1/2 tablet by mouth once daily for anxiety     PHENobarbital 97.2 MG tablet  Commonly known as:  LUMINAL  Take 1 tablet (97.2 mg total) by mouth at bedtime.     PROCEL Powd  Take 1 scoop by mouth 2 (two) times daily.     sennosides-docusate sodium 8.6-50 MG tablet  Commonly known as:  SENOKOT-S  Take 3 tablets by mouth 2 (two) times daily. For constipation     SYSTANE 0.4-0.3 % Soln  Generic drug:  Polyethyl Glycol-Propyl Glycol  Apply 1 drop to eye 4 (four) times daily. Left eye         No Known Allergies   REVIEW OF SYSTEMS:  Unobtainable due to advanced dementia  PHYSICAL EXAMINATION  GENERAL: no acute distress, normal body habitus EYES: conjunctivae normal, sclerae normal, normal eye lids NECK: supple, trachea midline, no neck masses, no thyroid tenderness, no thyromegaly LYMPHATICS: no LAN in the neck, no supraclavicular LAN RESPIRATORY: breathing is even & unlabored, BS CTAB CARDIAC: RRR, no murmur,no extra  heart sounds, no edema GI: abdomen soft, normal BS, no masses, no tenderness, no hepatomegaly, no splenomegaly EXTREMITIES:  Right hemiplegia; LUE full ROM, 5/5 strength; RUE contracted; BLE no movement; uses geri-chair PSYCHIATRIC: the patient is alert & oriented to person, affect & behavior appropriate  LABS/RADIOLOGY: Labs reviewed: Basic Metabolic Panel:  Recent Labs  16/10/96 08/27/15  NA 133* 138  K 4.3 4.5  BUN 10 12  CREATININE 0.6 0.7   Liver Function Tests:  Recent Labs  02/06/15 08/27/15  AST 11* 16  ALT 11 16  ALKPHOS 86 119   CBC:  Recent Labs  02/06/15 08/27/15  WBC 5.6 5.5  HGB 12.8* 14.0  HCT 38* 43  PLT 231 191   06/25/15  Chest x-ray shows nondisplaced fracture age indeterminate right upper Humeral shaft 06/23/15  right shoulder x-ray shows  Surgical neck humerus 03/24/15  carbamazepine 6.0 02/03/15  WBC 5.6 hemoglobin  12.8 hematocrit 37.5 MCV 92.6 platelet 231 total bilirubin 0.4 direct bilirubin 0.1 in direct bilirubin 0.3 alkaline phosphatase 86 SGOT 11 SGPT 11 total protein 5.3 albumin 3.0 carbamazepine 6.7 phenobarbital 20.5 01/08/15  WBC 6.7 hemoglobin 13.7 hematocrit 40.7 MCV 95.1 platelet count 216 sodium 135 potassium 4.3 glucose 85 BUN 8 creatinine 0.66 total bilirubin 0.5 alkaline phosphatase 92 SGOT 16 SGPT 18 total protein 5.2 albumin 3.3 calcium 8.2 carbamazepine 4.5 10/15/14  sodium 133 potassium 4.3 glucose 75 BUN 10 creatinine 0.63 calcium 8.5  ASSESSMENT/PLAN:  Seizure - continue Tegretol 300 mg 4 times a day and phenobarbital 97.2 mg daily at bedtime  Constipation - continue senna S2 tabs by mouth twice a day and Dulcolax suppository rectally every other HS  Dementia - advanced  Depression - mood is stable; continue Celexa 10 mg by mouth daily  Generalized pain - continue Norco 5/325 mg by mouth daily  Anxiety - continue Ativan 0.25 mg by mouth daily  Right Hemiplegia -  requires assistance with ADLs and mobility; continue supportive care  Protein calorie malnutrition - albumin 3.0; continue Procel 1 scoop by mouth twice a day    Goals of care:  Long-term care    [facility], NP [facility] [phone]

## 2015-10-08 DIAGNOSIS — M79672 Pain in left foot: Secondary | ICD-10-CM | POA: Diagnosis not present

## 2015-10-08 DIAGNOSIS — B351 Tinea unguium: Secondary | ICD-10-CM | POA: Diagnosis not present

## 2015-10-08 DIAGNOSIS — M79671 Pain in right foot: Secondary | ICD-10-CM | POA: Diagnosis not present

## 2015-10-08 DIAGNOSIS — G8190 Hemiplegia, unspecified affecting unspecified side: Secondary | ICD-10-CM | POA: Diagnosis not present

## 2015-10-27 ENCOUNTER — Non-Acute Institutional Stay (SKILLED_NURSING_FACILITY): Payer: Medicare Other | Admitting: Adult Health

## 2015-10-27 ENCOUNTER — Encounter: Payer: Self-pay | Admitting: Adult Health

## 2015-10-27 DIAGNOSIS — F329 Major depressive disorder, single episode, unspecified: Secondary | ICD-10-CM | POA: Diagnosis not present

## 2015-10-27 DIAGNOSIS — K5900 Constipation, unspecified: Secondary | ICD-10-CM

## 2015-10-27 DIAGNOSIS — E46 Unspecified protein-calorie malnutrition: Secondary | ICD-10-CM | POA: Diagnosis not present

## 2015-10-27 DIAGNOSIS — S42301S Unspecified fracture of shaft of humerus, right arm, sequela: Secondary | ICD-10-CM | POA: Diagnosis not present

## 2015-10-27 DIAGNOSIS — F02818 Dementia in other diseases classified elsewhere, unspecified severity, with other behavioral disturbance: Secondary | ICD-10-CM

## 2015-10-27 DIAGNOSIS — G8191 Hemiplegia, unspecified affecting right dominant side: Secondary | ICD-10-CM | POA: Diagnosis not present

## 2015-10-27 DIAGNOSIS — G40909 Epilepsy, unspecified, not intractable, without status epilepticus: Secondary | ICD-10-CM

## 2015-10-27 DIAGNOSIS — F0281 Dementia in other diseases classified elsewhere with behavioral disturbance: Secondary | ICD-10-CM

## 2015-10-27 DIAGNOSIS — F419 Anxiety disorder, unspecified: Secondary | ICD-10-CM

## 2015-10-27 DIAGNOSIS — F32A Depression, unspecified: Secondary | ICD-10-CM

## 2015-10-27 NOTE — Progress Notes (Deleted)
Patient ID: Joshua Munoz, male   DOB: 02/26/1951, 65 y.o.   MRN: [id number]   DATE:   10/27/15  Facility:  Nursing Home Location:  [facility] Room Number: [id number] LEVEL OF CARE:  SNF (31)   Chief Complaint  Patient presents with  . Medical Management of Chronic Issues    HISTORY OF PRESENT ILLNESS:  This is a 65-year- old male who is being seen for a routine visit.  Last October 2016, he had fracture of right upper humeral shaft. Orthopedic was consulted and splint to RUE was recommended. He is mostly non-verbal, smiles @ staff, pulls staff or bangs on the wheelchair. He has right hemiplegia and needs extensive assistance with ADLs. He needs 2 person assistance and hoyer lift with transfers. No recent seizure episode.  PAST MEDICAL HISTORY:  Past Medical History  Diagnosis Date  . Anxiety   . Seizures (HCC)   . Depression   . Dysphagia   . Cerebral trauma (HCC)   . Hemiplegia affecting right dominant side (HCC)   . Dry eye syndrome   . Constipation   . Contracture of elbow   . Vascular dementia without behavioral disturbance   . Generalized pain   . Protein calorie malnutrition (HCC)   . Right humeral fracture   . 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  Always use your most recent med list.               acetaminophen 325 MG tablet  Commonly known as:  TYLENOL  Take 650 mg by mouth every 6 (six) hours as needed for mild pain.     bisacodyl 10 MG suppository  Commonly known as:  DULCOLAX  Place 10 mg rectally at bedtime as needed (Every other night).     carbamazepine 200 MG tablet  Commonly known as:  TEGRETOL  Take 300 mg by mouth 4 (four) times daily. Take 1 1/2 tab = 300 mg     CERTAVITE/ANTIOXIDANTS PO  Take 1 tablet by mouth daily.     citalopram 10 MG tablet    Commonly known as:  CELEXA  Take 1 tablet by mouth daily.     HYDROcodone-acetaminophen 5-325 MG tablet  Commonly known as:  NORCO/VICODIN  Take one tablet by mouth once daily for pain. Do not exceed 4gm of Tylenol in 24 hours     LORazepam 0.5 MG tablet  Commonly known as:  ATIVAN  Take 1/2 tablet by mouth once daily for anxiety     PHENobarbital 97.2 MG tablet  Commonly known as:  LUMINAL  Take 1 tablet (97.2 mg total) by mouth at bedtime.     PROCEL Powd  Take 1 scoop by mouth 2 (two) times daily.     sennosides-docusate sodium 8.6-50 MG tablet  Commonly known as:  SENOKOT-S  Take 3 tablets by mouth 2 (two) times daily. For constipation     SYSTANE 0.4-0.3 % Soln  Generic drug:  Polyethyl Glycol-Propyl Glycol  Apply 1 drop to eye 4 (four) times daily. Left eye         No Known Allergies   REVIEW OF SYSTEMS:  Unobtainable due to advanced dementia  PHYSICAL EXAMINATION  GENERAL: no acute distress, normal body habitus EYES: conjunctivae normal, sclerae normal, normal eye lids NECK:   trachea midline, no neck masses, no thyroid tenderness, no thyromegaly LYMPHATICS: no LAN in the neck, no supraclavicular LAN RESPIRATORY: breathing is even & unlabored, BS CTAB CARDIAC: RRR, no murmur,no extra heart sounds, no edema GI: abdomen soft, normal BS, no masses, no tenderness, no hepatomegaly, no splenomegaly EXTREMITIES:  Right hemiplegia; LUE full ROM, 5/5 strength; RUE contracted; BLE no movement; uses geri-chair PSYCHIATRIC: the patient is alert & oriented to person, affect & behavior appropriate  LABS/RADIOLOGY: Labs reviewed: Basic Metabolic Panel:  Recent Labs  16/10/96  NA 138  K 4.5  BUN 12  CREATININE 0.7   Liver Function Tests:  Recent Labs  02/06/15 08/27/15  AST 11* 16  ALT 11 16  ALKPHOS 86 119   CBC:  Recent Labs  02/06/15 08/27/15  WBC 5.6 5.5  HGB 12.8* 14.0  HCT 38* 43  PLT 231 191   06/25/15  Chest x-ray shows nondisplaced  fracture age indeterminate right upper Humeral shaft 06/23/15  right shoulder x-ray shows  Surgical neck humerus 03/24/15  carbamazepine 6.0 02/03/15  WBC 5.6 hemoglobin 12.8 hematocrit 37.5 MCV 92.6 platelet 231 total bilirubin 0.4 direct bilirubin 0.1 in direct bilirubin 0.3 alkaline phosphatase 86 SGOT 11 SGPT 11 total protein 5.3 albumin 3.0 carbamazepine 6.7 phenobarbital 20.5 01/08/15  WBC 6.7 hemoglobin 13.7 hematocrit 40.7 MCV 95.1 platelet count 216 sodium 135 potassium 4.3 glucose 85 BUN 8 creatinine 0.66 total bilirubin 0.5 alkaline phosphatase 92 SGOT 16 SGPT 18 total protein 5.2 albumin 3.3 calcium 8.2 carbamazepine 4.5 10/15/14  sodium 133 potassium 4.3 glucose 75 BUN 10 creatinine 0.63 calcium 8.5  ASSESSMENT/PLAN:  Right upper humeral shaft fracture - continue splint and Norco 5/325 mg 1 tab PO Q D and Tylenol 325 mg give 2 tabs = 650 mg PO Q 6 hours Prn for pain  Seizure - no recent seizure episode; continue Tegretol 300 mg 4 times a day and phenobarbital 97.2 mg daily at bedtime; check phenobarbital and tegretol level  Constipation - continue senna S 3 tabs by mouth twice a day and Dulcolax suppository rectally every other HS  Dementia - advanced  Depression - mood is stable; continue Celexa 10 mg by mouth daily  Anxiety - mood is stable; continue Ativan 0.25 mg by mouth daily  Right Hemiplegia -  continue supportive care  Protein calorie malnutrition -  continue Procel 1 scoop by mouth twice a day     Goals of care:  Long-term care    [facility], NP [facility] [phone]

## 2015-11-03 DIAGNOSIS — Z79899 Other long term (current) drug therapy: Secondary | ICD-10-CM | POA: Diagnosis not present

## 2015-11-10 ENCOUNTER — Encounter: Payer: Self-pay | Admitting: Adult Health

## 2015-11-10 ENCOUNTER — Non-Acute Institutional Stay (SKILLED_NURSING_FACILITY): Payer: Medicare Other | Admitting: Adult Health

## 2015-11-10 DIAGNOSIS — L0292 Furuncle, unspecified: Secondary | ICD-10-CM | POA: Diagnosis not present
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
## 2015-11-10 NOTE — Progress Notes (Signed)
Patient ID: Joshua Munoz, male   DOB: March 11, 1951, 65 y.o.   MRN: [id number]

## 2015-11-19 ENCOUNTER — Encounter: Payer: Self-pay | Admitting: Adult Health

## 2015-11-19 ENCOUNTER — Non-Acute Institutional Stay (SKILLED_NURSING_FACILITY): Payer: Medicare Other | Admitting: Adult Health

## 2015-11-19 DIAGNOSIS — G40909 Epilepsy, unspecified, not intractable, without status epilepticus: Secondary | ICD-10-CM

## 2015-11-19 DIAGNOSIS — F02818 Dementia in other diseases classified elsewhere, unspecified severity, with other behavioral disturbance: Secondary | ICD-10-CM

## 2015-11-19 DIAGNOSIS — E46 Unspecified protein-calorie malnutrition: Secondary | ICD-10-CM

## 2015-11-19 DIAGNOSIS — F419 Anxiety disorder, unspecified: Secondary | ICD-10-CM | POA: Diagnosis not present

## 2015-11-19 DIAGNOSIS — F32A Depression, unspecified: Secondary | ICD-10-CM

## 2015-11-19 DIAGNOSIS — L0292 Furuncle, unspecified: Secondary | ICD-10-CM

## 2015-11-19 DIAGNOSIS — F0281 Dementia in other diseases classified elsewhere with behavioral disturbance: Secondary | ICD-10-CM | POA: Diagnosis not present

## 2015-11-19 DIAGNOSIS — K5900 Constipation, unspecified: Secondary | ICD-10-CM | POA: Diagnosis not present

## 2015-11-19 DIAGNOSIS — G8191 Hemiplegia, unspecified affecting right dominant side: Secondary | ICD-10-CM | POA: Diagnosis not present

## 2015-11-19 DIAGNOSIS — F329 Major depressive disorder, single episode, unspecified: Secondary | ICD-10-CM | POA: Diagnosis not present
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
## 2015-11-21 ENCOUNTER — Encounter: Payer: Self-pay | Admitting: Adult Health

## 2015-11-21 DIAGNOSIS — K5900 Constipation, unspecified: Secondary | ICD-10-CM

## 2015-11-21 DIAGNOSIS — G8191 Hemiplegia, unspecified affecting right dominant side: Secondary | ICD-10-CM

## 2015-11-21 DIAGNOSIS — E46 Unspecified protein-calorie malnutrition: Secondary | ICD-10-CM

## 2015-11-21 DIAGNOSIS — G40909 Epilepsy, unspecified, not intractable, without status epilepticus: Secondary | ICD-10-CM

## 2015-11-21 DIAGNOSIS — L0292 Furuncle, unspecified: Secondary | ICD-10-CM

## 2015-11-21 DIAGNOSIS — F0281 Dementia in other diseases classified elsewhere with behavioral disturbance: Secondary | ICD-10-CM

## 2015-11-21 DIAGNOSIS — F329 Major depressive disorder, single episode, unspecified: Secondary | ICD-10-CM

## 2015-11-21 DIAGNOSIS — F419 Anxiety disorder, unspecified: Secondary | ICD-10-CM

## 2015-11-21 DIAGNOSIS — F02818 Dementia in other diseases classified elsewhere, unspecified severity, with other behavioral disturbance: Secondary | ICD-10-CM

## 2015-11-21 DIAGNOSIS — F32A Depression, unspecified: Secondary | ICD-10-CM

## 2015-11-21 NOTE — Progress Notes (Signed)
This encounter was created in error - please disregard.

## 2015-11-22 ENCOUNTER — Other Ambulatory Visit: Payer: Self-pay | Admitting: *Deleted

## 2015-11-22 DIAGNOSIS — Z79899 Other long term (current) drug therapy: Secondary | ICD-10-CM | POA: Diagnosis not present

## 2015-11-22 LAB — HEPATIC FUNCTION PANEL
ALT: 12 U/L (ref 10–40)
AST: 16 U/L (ref 14–40)
Alkaline Phosphatase: 99 U/L (ref 25–125)
BILIRUBIN, TOTAL: 0.5 mg/dL

## 2015-11-22 LAB — BASIC METABOLIC PANEL
BUN: 10 mg/dL (ref 4–21)
Creatinine: 0.7 mg/dL (ref 0.6–1.3)
GLUCOSE: 76 mg/dL
POTASSIUM: 4.8 mmol/L (ref 3.4–5.3)
Sodium: 134 mmol/L — AB (ref 137–147)

## 2015-11-22 LAB — CBC AND DIFFERENTIAL
HCT: 42 % (ref 41–53)
Hemoglobin: 13.7 g/dL (ref 13.5–17.5)
PLATELETS: 243 10*3/uL (ref 150–399)
WBC: 6.1 10^3/mL

## 2015-11-22 MED ORDER — PHENOBARBITAL 97.2 MG PO TABS: ORAL_TABLET | ORAL | Status: DC

## 2015-11-22 NOTE — Telephone Encounter (Signed)
[facility] 

## 2015-12-21 ENCOUNTER — Other Ambulatory Visit: Payer: Self-pay | Admitting: *Deleted

## 2015-12-21 ENCOUNTER — Encounter: Payer: Self-pay | Admitting: Internal Medicine

## 2015-12-21 ENCOUNTER — Non-Acute Institutional Stay (SKILLED_NURSING_FACILITY): Payer: Medicare Other | Admitting: Internal Medicine

## 2015-12-21 DIAGNOSIS — F329 Major depressive disorder, single episode, unspecified: Secondary | ICD-10-CM | POA: Insufficient documentation

## 2015-12-21 DIAGNOSIS — G40909 Epilepsy, unspecified, not intractable, without status epilepticus: Secondary | ICD-10-CM | POA: Diagnosis not present

## 2015-12-21 DIAGNOSIS — S21201D Unspecified open wound of right back wall of thorax without penetration into thoracic cavity, subsequent encounter: Secondary | ICD-10-CM

## 2015-12-21 DIAGNOSIS — R1319 Other dysphagia: Secondary | ICD-10-CM | POA: Insufficient documentation

## 2015-12-21 DIAGNOSIS — T402X5A Adverse effect of other opioids, initial encounter: Secondary | ICD-10-CM

## 2015-12-21 DIAGNOSIS — E46 Unspecified protein-calorie malnutrition: Secondary | ICD-10-CM | POA: Diagnosis not present

## 2015-12-21 DIAGNOSIS — I69398 Other sequelae of cerebral infarction: Secondary | ICD-10-CM

## 2015-12-21 DIAGNOSIS — K5903 Drug induced constipation: Secondary | ICD-10-CM | POA: Diagnosis not present

## 2015-12-21 DIAGNOSIS — R1310 Dysphagia, unspecified: Secondary | ICD-10-CM

## 2015-12-21 MED ORDER — PHENOBARBITAL 97.2 MG PO TABS: ORAL_TABLET | ORAL | Status: DC
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
## 2015-12-22 DIAGNOSIS — S21201D Unspecified open wound of right back wall of thorax without penetration into thoracic cavity, subsequent encounter: Secondary | ICD-10-CM | POA: Diagnosis not present

## 2016-01-04 DIAGNOSIS — Z79899 Other long term (current) drug therapy: Secondary | ICD-10-CM | POA: Diagnosis not present

## 2016-01-04 LAB — BASIC METABOLIC PANEL
BUN: 9 mg/dL (ref 4–21)
Creatinine: 0.7 mg/dL (ref 0.6–1.3)
Glucose: 92 mg/dL
Potassium: 4.3 mmol/L (ref 3.4–5.3)
Sodium: 139 mmol/L (ref 137–147)

## 2016-01-04 LAB — HEPATIC FUNCTION PANEL
ALK PHOS: 106 U/L (ref 25–125)
ALT: 15 U/L (ref 10–40)
AST: 13 U/L — AB (ref 14–40)
Bilirubin, Total: 0.4 mg/dL

## 2016-01-04 LAB — LIPID PANEL
CHOLESTEROL: 219 mg/dL — AB (ref 0–200)
HDL: 56 mg/dL (ref 35–70)
LDL Cholesterol: 139 mg/dL
TRIGLYCERIDES: 120 mg/dL (ref 40–160)

## 2016-01-04 LAB — HEMOGLOBIN A1C: Hemoglobin A1C: 5

## 2016-01-04 LAB — TSH: TSH: 2.68 u[IU]/mL (ref 0.41–5.90)

## 2016-01-05 ENCOUNTER — Encounter: Payer: Self-pay | Admitting: Adult Health

## 2016-01-05 ENCOUNTER — Non-Acute Institutional Stay (SKILLED_NURSING_FACILITY): Payer: Medicare Other | Admitting: Adult Health

## 2016-01-05 DIAGNOSIS — E785 Hyperlipidemia, unspecified: Secondary | ICD-10-CM
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
## 2016-01-13 DIAGNOSIS — R509 Fever, unspecified: Secondary | ICD-10-CM | POA: Diagnosis not present

## 2016-01-17 DIAGNOSIS — Z79899 Other long term (current) drug therapy: Secondary | ICD-10-CM | POA: Diagnosis not present

## 2016-01-19 ENCOUNTER — Encounter: Payer: Self-pay | Admitting: Adult Health

## 2016-01-19 ENCOUNTER — Non-Acute Institutional Stay (SKILLED_NURSING_FACILITY): Payer: Medicare Other | Admitting: Adult Health

## 2016-01-19 DIAGNOSIS — F02818 Dementia in other diseases classified elsewhere, unspecified severity, with other behavioral disturbance: Secondary | ICD-10-CM

## 2016-01-19 DIAGNOSIS — F419 Anxiety disorder, unspecified: Secondary | ICD-10-CM | POA: Diagnosis not present

## 2016-01-19 DIAGNOSIS — E785 Hyperlipidemia, unspecified: Secondary | ICD-10-CM

## 2016-01-19 DIAGNOSIS — I69398 Other sequelae of cerebral infarction: Secondary | ICD-10-CM

## 2016-01-19 DIAGNOSIS — F0281 Dementia in other diseases classified elsewhere with behavioral disturbance: Secondary | ICD-10-CM

## 2016-01-19 DIAGNOSIS — F329 Major depressive disorder, single episode, unspecified: Secondary | ICD-10-CM

## 2016-01-19 DIAGNOSIS — F32A Depression, unspecified: Secondary | ICD-10-CM

## 2016-01-19 DIAGNOSIS — G40909 Epilepsy, unspecified, not intractable, without status epilepticus: Secondary | ICD-10-CM | POA: Diagnosis not present

## 2016-01-19 DIAGNOSIS — K5900 Constipation, unspecified: Secondary | ICD-10-CM | POA: Diagnosis not present

## 2016-01-19 DIAGNOSIS — E46 Unspecified protein-calorie malnutrition: Secondary | ICD-10-CM | POA: Diagnosis not present
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
## 2016-01-20 ENCOUNTER — Other Ambulatory Visit: Payer: Self-pay

## 2016-01-20 MED ORDER — PHENOBARBITAL 97.2 MG PO TABS: ORAL_TABLET | ORAL | Status: DC

## 2016-01-20 NOTE — Telephone Encounter (Signed)
[facility]  [address] [place] [address]  Phone: [phone]  Fax: [phone]  

## 2016-01-21 ENCOUNTER — Encounter: Payer: Self-pay | Admitting: Adult Health

## 2016-01-21 ENCOUNTER — Non-Acute Institutional Stay (SKILLED_NURSING_FACILITY): Payer: Medicare Other | Admitting: Adult Health

## 2016-01-21 DIAGNOSIS — N390 Urinary tract infection, site not specified: Secondary | ICD-10-CM
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
## 2016-01-25 DIAGNOSIS — B351 Tinea unguium: Secondary | ICD-10-CM | POA: Diagnosis not present

## 2016-01-25 DIAGNOSIS — M79671 Pain in right foot: Secondary | ICD-10-CM | POA: Diagnosis not present

## 2016-01-25 DIAGNOSIS — M79672 Pain in left foot: Secondary | ICD-10-CM | POA: Diagnosis not present

## 2016-02-04 DIAGNOSIS — F419 Anxiety disorder, unspecified: Secondary | ICD-10-CM | POA: Diagnosis not present

## 2016-02-04 DIAGNOSIS — F329 Major depressive disorder, single episode, unspecified: Secondary | ICD-10-CM | POA: Diagnosis not present

## 2016-02-04 DIAGNOSIS — F0150 Vascular dementia without behavioral disturbance: Secondary | ICD-10-CM | POA: Diagnosis not present

## 2016-02-18 ENCOUNTER — Encounter: Payer: Self-pay | Admitting: Adult Health

## 2016-02-18 ENCOUNTER — Non-Acute Institutional Stay (SKILLED_NURSING_FACILITY): Payer: Medicare Other | Admitting: Adult Health

## 2016-02-18 DIAGNOSIS — F32A Depression, unspecified: Secondary | ICD-10-CM

## 2016-02-18 DIAGNOSIS — G40909 Epilepsy, unspecified, not intractable, without status epilepticus: Secondary | ICD-10-CM | POA: Diagnosis not present

## 2016-02-18 DIAGNOSIS — E46 Unspecified protein-calorie malnutrition: Secondary | ICD-10-CM | POA: Diagnosis not present

## 2016-02-18 DIAGNOSIS — F0281 Dementia in other diseases classified elsewhere with behavioral disturbance: Secondary | ICD-10-CM

## 2016-02-18 DIAGNOSIS — F02818 Dementia in other diseases classified elsewhere, unspecified severity, with other behavioral disturbance: Secondary | ICD-10-CM

## 2016-02-18 DIAGNOSIS — F329 Major depressive disorder, single episode, unspecified: Secondary | ICD-10-CM | POA: Diagnosis not present

## 2016-02-18 DIAGNOSIS — K5903 Drug induced constipation: Secondary | ICD-10-CM

## 2016-02-18 DIAGNOSIS — E785 Hyperlipidemia, unspecified: Secondary | ICD-10-CM | POA: Diagnosis not present

## 2016-02-18 DIAGNOSIS — F419 Anxiety disorder, unspecified: Secondary | ICD-10-CM

## 2016-02-18 DIAGNOSIS — T402X5A Adverse effect of other opioids, initial encounter: Secondary | ICD-10-CM | POA: Diagnosis not present
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
## 2016-02-21 ENCOUNTER — Other Ambulatory Visit: Payer: Self-pay | Admitting: *Deleted

## 2016-02-21 MED ORDER — PHENOBARBITAL 97.2 MG PO TABS: ORAL_TABLET | ORAL | Status: AC

## 2016-02-21 NOTE — Telephone Encounter (Signed)
[facility] 

## 2016-02-29 DIAGNOSIS — R293 Abnormal posture: Secondary | ICD-10-CM | POA: Diagnosis not present

## 2016-02-29 DIAGNOSIS — M24521 Contracture, right elbow: Secondary | ICD-10-CM | POA: Diagnosis not present

## 2016-03-01 DIAGNOSIS — M24521 Contracture, right elbow: Secondary | ICD-10-CM | POA: Diagnosis not present

## 2016-03-01 DIAGNOSIS — R293 Abnormal posture: Secondary | ICD-10-CM | POA: Diagnosis not present

## 2016-03-02 DIAGNOSIS — M24521 Contracture, right elbow: Secondary | ICD-10-CM | POA: Diagnosis not present

## 2016-03-02 DIAGNOSIS — R293 Abnormal posture: Secondary | ICD-10-CM | POA: Diagnosis not present

## 2016-03-03 DIAGNOSIS — M24521 Contracture, right elbow: Secondary | ICD-10-CM | POA: Diagnosis not present

## 2016-03-03 DIAGNOSIS — R293 Abnormal posture: Secondary | ICD-10-CM | POA: Diagnosis not present

## 2016-03-06 DIAGNOSIS — M24521 Contracture, right elbow: Secondary | ICD-10-CM | POA: Diagnosis not present

## 2016-03-06 DIAGNOSIS — R293 Abnormal posture: Secondary | ICD-10-CM | POA: Diagnosis not present

## 2016-03-07 DIAGNOSIS — R293 Abnormal posture: Secondary | ICD-10-CM | POA: Diagnosis not present

## 2016-03-07 DIAGNOSIS — M24521 Contracture, right elbow: Secondary | ICD-10-CM | POA: Diagnosis not present

## 2016-03-08 DIAGNOSIS — M24521 Contracture, right elbow: Secondary | ICD-10-CM | POA: Diagnosis not present

## 2016-03-08 DIAGNOSIS — R293 Abnormal posture: Secondary | ICD-10-CM | POA: Diagnosis not present

## 2016-03-09 DIAGNOSIS — R293 Abnormal posture: Secondary | ICD-10-CM | POA: Diagnosis not present

## 2016-03-09 DIAGNOSIS — M24521 Contracture, right elbow: Secondary | ICD-10-CM | POA: Diagnosis not present

## 2016-03-10 DIAGNOSIS — R293 Abnormal posture: Secondary | ICD-10-CM | POA: Diagnosis not present

## 2016-03-10 DIAGNOSIS — M24521 Contracture, right elbow: Secondary | ICD-10-CM | POA: Diagnosis not present

## 2016-03-13 DIAGNOSIS — M24521 Contracture, right elbow: Secondary | ICD-10-CM | POA: Diagnosis not present

## 2016-03-13 DIAGNOSIS — R293 Abnormal posture: Secondary | ICD-10-CM | POA: Diagnosis not present

## 2016-03-14 DIAGNOSIS — R293 Abnormal posture: Secondary | ICD-10-CM | POA: Diagnosis not present

## 2016-03-14 DIAGNOSIS — M24521 Contracture, right elbow: Secondary | ICD-10-CM | POA: Diagnosis not present

## 2016-03-15 DIAGNOSIS — R293 Abnormal posture: Secondary | ICD-10-CM | POA: Diagnosis not present

## 2016-03-15 DIAGNOSIS — M24521 Contracture, right elbow: Secondary | ICD-10-CM | POA: Diagnosis not present

## 2016-03-16 DIAGNOSIS — R293 Abnormal posture: Secondary | ICD-10-CM | POA: Diagnosis not present

## 2016-03-16 DIAGNOSIS — M24521 Contracture, right elbow: Secondary | ICD-10-CM | POA: Diagnosis not present

## 2016-03-17 ENCOUNTER — Encounter: Payer: Self-pay | Admitting: Adult Health

## 2016-03-17 ENCOUNTER — Non-Acute Institutional Stay (SKILLED_NURSING_FACILITY): Payer: Medicare Other | Admitting: Adult Health

## 2016-03-17 DIAGNOSIS — T402X5A Adverse effect of other opioids, initial encounter: Secondary | ICD-10-CM

## 2016-03-17 DIAGNOSIS — K5903 Drug induced constipation: Secondary | ICD-10-CM

## 2016-03-17 DIAGNOSIS — F419 Anxiety disorder, unspecified: Secondary | ICD-10-CM | POA: Diagnosis not present

## 2016-03-17 DIAGNOSIS — G8191 Hemiplegia, unspecified affecting right dominant side: Secondary | ICD-10-CM | POA: Diagnosis not present

## 2016-03-17 DIAGNOSIS — E46 Unspecified protein-calorie malnutrition: Secondary | ICD-10-CM

## 2016-03-17 DIAGNOSIS — F32A Depression, unspecified: Secondary | ICD-10-CM

## 2016-03-17 DIAGNOSIS — G40909 Epilepsy, unspecified, not intractable, without status epilepticus: Secondary | ICD-10-CM

## 2016-03-17 DIAGNOSIS — E785 Hyperlipidemia, unspecified: Secondary | ICD-10-CM

## 2016-03-17 DIAGNOSIS — F0281 Dementia in other diseases classified elsewhere with behavioral disturbance: Secondary | ICD-10-CM

## 2016-03-17 DIAGNOSIS — F02818 Dementia in other diseases classified elsewhere, unspecified severity, with other behavioral disturbance: Secondary | ICD-10-CM

## 2016-03-17 DIAGNOSIS — F329 Major depressive disorder, single episode, unspecified: Secondary | ICD-10-CM
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
## 2016-03-21 DIAGNOSIS — Z79899 Other long term (current) drug therapy: Secondary | ICD-10-CM | POA: Diagnosis not present

## 2016-03-21 LAB — CBC AND DIFFERENTIAL
HEMATOCRIT: 40 % — AB (ref 41–53)
Hemoglobin: 12.9 g/dL — AB (ref 13.5–17.5)
Neutrophils Absolute: 4 /uL
Platelets: 178 10*3/uL (ref 150–399)
WBC: 5.9 10*3/mL

## 2016-03-21 LAB — BASIC METABOLIC PANEL
BUN: 12 mg/dL (ref 4–21)
CREATININE: 0.6 mg/dL (ref 0.6–1.3)
GLUCOSE: 88 mg/dL
POTASSIUM: 4.3 mmol/L (ref 3.4–5.3)
SODIUM: 135 mmol/L — AB (ref 137–147)

## 2016-03-21 LAB — HEPATIC FUNCTION PANEL
ALT: 10 U/L (ref 10–40)
AST: 13 U/L — AB (ref 14–40)
Alkaline Phosphatase: 91 U/L (ref 25–125)
Bilirubin, Total: 0.4 mg/dL

## 2016-03-27 DIAGNOSIS — G40901 Epilepsy, unspecified, not intractable, with status epilepticus: Secondary | ICD-10-CM | POA: Diagnosis not present

## 2016-03-28 DIAGNOSIS — G40901 Epilepsy, unspecified, not intractable, with status epilepticus: Secondary | ICD-10-CM | POA: Diagnosis not present

## 2016-04-06 DIAGNOSIS — M79672 Pain in left foot: Secondary | ICD-10-CM | POA: Diagnosis not present

## 2016-04-06 DIAGNOSIS — B351 Tinea unguium: Secondary | ICD-10-CM | POA: Diagnosis not present

## 2016-04-06 DIAGNOSIS — M79671 Pain in right foot: Secondary | ICD-10-CM | POA: Diagnosis not present

## 2016-04-07 DIAGNOSIS — Z79899 Other long term (current) drug therapy: Secondary | ICD-10-CM | POA: Diagnosis not present

## 2016-04-12 ENCOUNTER — Encounter: Payer: Self-pay | Admitting: Adult Health

## 2016-04-12 ENCOUNTER — Non-Acute Institutional Stay (SKILLED_NURSING_FACILITY): Payer: Medicare Other | Admitting: Adult Health

## 2016-04-12 DIAGNOSIS — E46 Unspecified protein-calorie malnutrition: Secondary | ICD-10-CM | POA: Diagnosis not present

## 2016-04-12 DIAGNOSIS — F419 Anxiety disorder, unspecified: Secondary | ICD-10-CM

## 2016-04-12 DIAGNOSIS — F0281 Dementia in other diseases classified elsewhere with behavioral disturbance: Secondary | ICD-10-CM

## 2016-04-12 DIAGNOSIS — G8191 Hemiplegia, unspecified affecting right dominant side: Secondary | ICD-10-CM | POA: Diagnosis not present

## 2016-04-12 DIAGNOSIS — T402X5A Adverse effect of other opioids, initial encounter: Secondary | ICD-10-CM

## 2016-04-12 DIAGNOSIS — F329 Major depressive disorder, single episode, unspecified: Secondary | ICD-10-CM | POA: Diagnosis not present

## 2016-04-12 DIAGNOSIS — G40909 Epilepsy, unspecified, not intractable, without status epilepticus: Secondary | ICD-10-CM | POA: Diagnosis not present

## 2016-04-12 DIAGNOSIS — E785 Hyperlipidemia, unspecified: Secondary | ICD-10-CM | POA: Diagnosis not present

## 2016-04-12 DIAGNOSIS — K5903 Drug induced constipation: Secondary | ICD-10-CM | POA: Diagnosis not present

## 2016-04-12 DIAGNOSIS — I69398 Other sequelae of cerebral infarction: Secondary | ICD-10-CM | POA: Diagnosis not present

## 2016-04-12 DIAGNOSIS — F02818 Dementia in other diseases classified elsewhere, unspecified severity, with other behavioral disturbance: Secondary | ICD-10-CM

## 2016-04-12 DIAGNOSIS — F32A Depression, unspecified: Secondary | ICD-10-CM
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
## 2016-04-24 ENCOUNTER — Other Ambulatory Visit: Payer: Self-pay | Admitting: *Deleted

## 2016-04-24 MED ORDER — LORAZEPAM 0.5 MG PO TABS: ORAL_TABLET | ORAL | 0 refills | Status: DC

## 2016-04-24 NOTE — Telephone Encounter (Signed)
[facility] #[phone] Fax: [phone] 

## 2016-04-25 DIAGNOSIS — Z79899 Other long term (current) drug therapy: Secondary | ICD-10-CM | POA: Diagnosis not present

## 2016-05-25 ENCOUNTER — Encounter: Payer: Self-pay | Admitting: Internal Medicine

## 2016-05-25 ENCOUNTER — Non-Acute Institutional Stay (SKILLED_NURSING_FACILITY): Payer: Medicare Other | Admitting: Internal Medicine

## 2016-05-25 DIAGNOSIS — G894 Chronic pain syndrome: Secondary | ICD-10-CM | POA: Insufficient documentation

## 2016-05-25 DIAGNOSIS — F329 Major depressive disorder, single episode, unspecified: Secondary | ICD-10-CM

## 2016-05-25 DIAGNOSIS — G40909 Epilepsy, unspecified, not intractable, without status epilepticus: Secondary | ICD-10-CM | POA: Diagnosis not present

## 2016-05-25 DIAGNOSIS — K5900 Constipation, unspecified: Secondary | ICD-10-CM | POA: Diagnosis not present

## 2016-05-25 DIAGNOSIS — Z1211 Encounter for screening for malignant neoplasm of colon: Secondary | ICD-10-CM

## 2016-05-25 DIAGNOSIS — R1310 Dysphagia, unspecified: Secondary | ICD-10-CM

## 2016-05-25 DIAGNOSIS — Z87820 Personal history of traumatic brain injury: Secondary | ICD-10-CM | POA: Diagnosis not present

## 2016-05-25 DIAGNOSIS — K5909 Other constipation: Secondary | ICD-10-CM
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
## 2016-06-08 DIAGNOSIS — F419 Anxiety disorder, unspecified: Secondary | ICD-10-CM | POA: Diagnosis not present

## 2016-06-08 DIAGNOSIS — F339 Major depressive disorder, recurrent, unspecified: Secondary | ICD-10-CM | POA: Diagnosis not present

## 2016-06-08 DIAGNOSIS — F0150 Vascular dementia without behavioral disturbance: Secondary | ICD-10-CM | POA: Diagnosis not present

## 2016-06-20 ENCOUNTER — Encounter: Payer: Self-pay | Admitting: Adult Health

## 2016-06-20 ENCOUNTER — Non-Acute Institutional Stay (SKILLED_NURSING_FACILITY): Payer: Medicare Other | Admitting: Adult Health

## 2016-06-20 DIAGNOSIS — I69398 Other sequelae of cerebral infarction: Secondary | ICD-10-CM | POA: Diagnosis not present

## 2016-06-20 DIAGNOSIS — T402X5A Adverse effect of other opioids, initial encounter: Secondary | ICD-10-CM

## 2016-06-20 DIAGNOSIS — G40909 Epilepsy, unspecified, not intractable, without status epilepticus: Secondary | ICD-10-CM | POA: Diagnosis not present

## 2016-06-20 DIAGNOSIS — G8191 Hemiplegia, unspecified affecting right dominant side: Secondary | ICD-10-CM

## 2016-06-20 DIAGNOSIS — E785 Hyperlipidemia, unspecified: Secondary | ICD-10-CM | POA: Diagnosis not present

## 2016-06-20 DIAGNOSIS — F0281 Dementia in other diseases classified elsewhere with behavioral disturbance: Secondary | ICD-10-CM | POA: Diagnosis not present

## 2016-06-20 DIAGNOSIS — G894 Chronic pain syndrome: Secondary | ICD-10-CM | POA: Diagnosis not present

## 2016-06-20 DIAGNOSIS — F419 Anxiety disorder, unspecified: Secondary | ICD-10-CM

## 2016-06-20 DIAGNOSIS — F02818 Dementia in other diseases classified elsewhere, unspecified severity, with other behavioral disturbance: Secondary | ICD-10-CM

## 2016-06-20 DIAGNOSIS — F329 Major depressive disorder, single episode, unspecified: Secondary | ICD-10-CM

## 2016-06-20 DIAGNOSIS — K5903 Drug induced constipation: Secondary | ICD-10-CM
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
## 2016-06-21 DIAGNOSIS — Z79899 Other long term (current) drug therapy: Secondary | ICD-10-CM | POA: Diagnosis not present

## 2016-07-16 DIAGNOSIS — R0989 Other specified symptoms and signs involving the circulatory and respiratory systems: Secondary | ICD-10-CM | POA: Diagnosis not present

## 2016-07-16 DIAGNOSIS — R05 Cough: Secondary | ICD-10-CM | POA: Diagnosis not present

## 2016-07-18 DIAGNOSIS — I10 Essential (primary) hypertension: Secondary | ICD-10-CM | POA: Diagnosis not present

## 2016-07-18 DIAGNOSIS — M24571 Contracture, right ankle: Secondary | ICD-10-CM | POA: Diagnosis not present

## 2016-07-18 DIAGNOSIS — R1312 Dysphagia, oropharyngeal phase: Secondary | ICD-10-CM | POA: Diagnosis not present

## 2016-07-18 DIAGNOSIS — M24572 Contracture, left ankle: Secondary | ICD-10-CM | POA: Diagnosis not present

## 2016-07-18 LAB — BASIC METABOLIC PANEL
BUN: 12 mg/dL (ref 4–21)
CREATININE: 0.7 mg/dL (ref 0.6–1.3)
GLUCOSE: 89 mg/dL
Potassium: 5 mmol/L (ref 3.4–5.3)
SODIUM: 141 mmol/L (ref 137–147)

## 2016-07-18 LAB — CBC AND DIFFERENTIAL
HCT: 43 % (ref 41–53)
Hemoglobin: 13.7 g/dL (ref 13.5–17.5)
NEUTROS ABS: 4 /uL
Platelets: 176 10*3/uL (ref 150–399)
WBC: 5.5 10^3/mL

## 2016-07-19 ENCOUNTER — Encounter: Payer: Self-pay | Admitting: Adult Health

## 2016-07-19 ENCOUNTER — Non-Acute Institutional Stay (SKILLED_NURSING_FACILITY): Payer: Medicare Other | Admitting: Adult Health

## 2016-07-19 DIAGNOSIS — R1310 Dysphagia, unspecified: Secondary | ICD-10-CM | POA: Diagnosis not present

## 2016-07-19 DIAGNOSIS — E785 Hyperlipidemia, unspecified: Secondary | ICD-10-CM | POA: Diagnosis not present

## 2016-07-19 DIAGNOSIS — T402X5A Adverse effect of other opioids, initial encounter: Secondary | ICD-10-CM

## 2016-07-19 DIAGNOSIS — K5903 Drug induced constipation: Secondary | ICD-10-CM | POA: Diagnosis not present

## 2016-07-19 DIAGNOSIS — F0281 Dementia in other diseases classified elsewhere with behavioral disturbance: Secondary | ICD-10-CM

## 2016-07-19 DIAGNOSIS — R1312 Dysphagia, oropharyngeal phase: Secondary | ICD-10-CM | POA: Diagnosis not present

## 2016-07-19 DIAGNOSIS — G8191 Hemiplegia, unspecified affecting right dominant side: Secondary | ICD-10-CM | POA: Diagnosis not present

## 2016-07-19 DIAGNOSIS — M24572 Contracture, left ankle: Secondary | ICD-10-CM | POA: Diagnosis not present

## 2016-07-19 DIAGNOSIS — G894 Chronic pain syndrome: Secondary | ICD-10-CM

## 2016-07-19 DIAGNOSIS — M24571 Contracture, right ankle: Secondary | ICD-10-CM | POA: Diagnosis not present

## 2016-07-19 DIAGNOSIS — F329 Major depressive disorder, single episode, unspecified: Secondary | ICD-10-CM

## 2016-07-19 DIAGNOSIS — F419 Anxiety disorder, unspecified: Secondary | ICD-10-CM | POA: Diagnosis not present

## 2016-07-19 DIAGNOSIS — F02818 Dementia in other diseases classified elsewhere, unspecified severity, with other behavioral disturbance: Secondary | ICD-10-CM

## 2016-07-19 DIAGNOSIS — G40909 Epilepsy, unspecified, not intractable, without status epilepticus: Secondary | ICD-10-CM | POA: Diagnosis not present

## 2016-07-19 NOTE — Progress Notes (Signed)
Patient ID: Joshua Munoz, male   DOB: 1951-08-29, 65 y.o.   MRN: [id number]   DATE:     07/19/16  Facility:  Nursing Home Location:  [facility] Room Number: 404-A LEVEL OF CARE:  SNF (31)   Chief Complaint  Patient presents with  . Medical Management of Chronic Issues    HISTORY OF PRESENT ILLNESS:  This is a 65 year old male who is being seen for a routine visit. [facility] is a [facility]. He is currently being seen by ST for dysphagia. He was seen in his room smiling. He was following simple commands.   PAST MEDICAL HISTORY:  Past Medical History:  Diagnosis Date  . Anxiety   . Cerebral trauma (HCC)   . Constipation   . Contracture of elbow   . 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 Depression   . Dry eye syndrome   . Dysphagia   . Generalized pain   . Hemiplegia affecting right dominant side (HCC)   . HLD (hyperlipidemia)   . Protein calorie malnutrition (HCC)   . Right humeral fracture   . Seizures (HCC)   . Therapeutic opioid induced constipation   . Urinary tract infection without hematuria   . Vascular dementia without behavioral disturbance     CURRENT MEDICATIONS: Reviewed per MAR/see medication list   Medication List       Accurate as of 07/19/16  2:44 PM. Always use your most recent med list.          acetaminophen 325 MG tablet Commonly known as:  TYLENOL Take 650 mg by mouth every 6 (six) hours as needed for mild pain.   atorvastatin 10 MG tablet Commonly known as:  LIPITOR Take 10 mg by mouth at bedtime.   bisacodyl 10 MG suppository Commonly known as:  DULCOLAX Place 10 mg rectally every other day. Give at bedtime every other day   carbamazepine 200 MG tablet Commonly known as:  TEGRETOL Take 300 mg by mouth 3 (three) times daily.   CERTAVITE/ANTIOXIDANTS PO Take 1 tablet by mouth daily.   citalopram 10 MG tablet Commonly known as:  CELEXA Take 10 mg  by mouth daily.   HYDROcodone-acetaminophen 5-325 MG tablet Commonly known as:  NORCO/VICODIN Take one tablet by mouth once daily for pain. Do not exceed 4gm of Tylenol in 24 hours   ketoconazole 2 % shampoo Commonly known as:  NIZORAL Apply 1 application topically. Apply to scalp on shower days   LORazepam 0.5 MG tablet Commonly known as:  ATIVAN Take 0.25 mg by mouth. Give one half tab= 0.25 mg by mouth daily, may give 0.25 mg daily PRN also   PHENobarbital 97.2 MG tablet Commonly known as:  LUMINAL Take one tablet by mouth every night at bedtime   polyethylene glycol packet Commonly known as:  MIRALAX / GLYCOLAX Take 17 g by mouth 2 (two) times daily.   sennosides-docusate sodium 8.6-50 MG tablet Commonly known as:  SENOKOT-S Take 2 tablets by mouth 2 (two) times daily. For constipation   SYSTANE 0.4-0.3 % Soln Generic drug:  Polyethyl Glycol-Propyl Glycol Apply 1 drop to eye 4 (four) times daily. Left eye   UNABLE TO FIND Med Name: Magic Cup BID as snack at 2PM and 8PM for nutritional support and increased protein        No Known Allergies   REVIEW OF SYSTEMS:  Unobtainable due to advanced dementia  PHYSICAL  EXAMINATION  GENERAL: no acute distress, normal body habitus SKIN:  Skin is warm to touch. EYES: conjunctivae normal, sclerae normal, normal eye lids NECK: supple, trachea midline, no neck masses, no thyroid tenderness, no thyromegaly LYMPHATICS: no LAN in the neck, no supraclavicular LAN RESPIRATORY: breathing is even & unlabored, BS CTAB CARDIAC: RRR, no murmur,no extra heart sounds, no edema GI: abdomen soft, normal BS, no masses, no tenderness, no hepatomegaly, no splenomegaly EXTREMITIES:  Right hemiplegia; LUE full ROM, 5/5 strength; RUE contracted; BLE no movement; uses geri-chair NEURO:  Slurred speech, right hemiplegia PSYCHIATRIC: he has good eye contact,  affect & behavior appropriate  LABS/RADIOLOGY: Labs reviewed: 07/18/16  Na 141  K 5.0   CO2 27.6  Glucose 89  BUN 12  Creatinine 0.69  Ca 8.7  GFR >60 06/21/16  Na 140  K 4.7  CO2 31.4  Glucose 121  Creatinine 0.57  Ca 8.3  GFR >60  Carbamazepine 4.0   Phenobarbital 18.5 11/03/15  Carbamazepine 6.6   Phenobarbital 21.9 Basic Metabolic Panel:  Recent Labs  86/57/8403/13/17 01/04/16 03/21/16  NA 134* 139 135*  K 4.8 4.3 4.3  BUN 10 9 12   CREATININE 0.7 0.7 0.6   Liver Function Tests:  Recent Labs  11/22/15 01/04/16 03/21/16  AST 16 13* 13*  ALT 12 15 10   ALKPHOS 99 106 91   CBC:  Recent Labs  08/27/15 11/22/15 03/21/16  WBC 5.5 6.1 5.9  NEUTROABS  --   --  4  HGB 14.0 13.7 12.9*  HCT 43 42 40*  PLT 191 243 178     ASSESSMENT/PLAN:  Dysphagia - recently started ST treatments; aspiration precaution  Hyperlipidemia - continue Atorvastatin 10 mg Q HS Lab Results  Component Value Date   CHOL 219 (A) 01/04/2016   HDL 56 01/04/2016   LDLCALC 139 01/04/2016   TRIG 120 01/04/2016    Depression - mood is stable; continue Celexa 10 mg by mouth daily; followed-up by Health Team for psychiatric care  Anxiety - mood is stable; continue Ativan 0.25 mg by mouth daily PRN  Dementia - advanced, continue supportive care  Seizure - no recent seizure episode; continue Tegretol 300 mg 3 times a day and phenobarbital 97.2 mg daily at bedtime; check phenobarbital and carbamazepine level; check CBC  Therapeutic Opioid constipation - continue senna S 2 tabs by mouth twice a day and Dulcolax suppository rectally every other HS  Right Hemiplegia -  continue supportive care; fall precaution  Chronic pain - well-controlled; continue  Norco 5/325 mg 1 tab daily     Goals of care:  Long-term care    Kenard GowerMonina Medina-Vargas, NP [facility] [phone]

## 2016-07-20 ENCOUNTER — Other Ambulatory Visit (HOSPITAL_COMMUNITY): Payer: Self-pay | Admitting: Internal Medicine

## 2016-07-20 DIAGNOSIS — R1319 Other dysphagia: Secondary | ICD-10-CM

## 2016-07-20 DIAGNOSIS — M24571 Contracture, right ankle: Secondary | ICD-10-CM | POA: Diagnosis not present

## 2016-07-20 DIAGNOSIS — R1312 Dysphagia, oropharyngeal phase: Secondary | ICD-10-CM | POA: Diagnosis not present

## 2016-07-20 DIAGNOSIS — M24572 Contracture, left ankle: Secondary | ICD-10-CM | POA: Diagnosis not present

## 2016-07-21 DIAGNOSIS — M24572 Contracture, left ankle: Secondary | ICD-10-CM | POA: Diagnosis not present

## 2016-07-21 DIAGNOSIS — M24571 Contracture, right ankle: Secondary | ICD-10-CM | POA: Diagnosis not present

## 2016-07-21 DIAGNOSIS — R1312 Dysphagia, oropharyngeal phase: Secondary | ICD-10-CM | POA: Diagnosis not present

## 2016-07-24 DIAGNOSIS — M24571 Contracture, right ankle: Secondary | ICD-10-CM | POA: Diagnosis not present

## 2016-07-24 DIAGNOSIS — R1312 Dysphagia, oropharyngeal phase: Secondary | ICD-10-CM | POA: Diagnosis not present

## 2016-07-24 DIAGNOSIS — M24572 Contracture, left ankle: Secondary | ICD-10-CM | POA: Diagnosis not present

## 2016-07-25 DIAGNOSIS — M24572 Contracture, left ankle: Secondary | ICD-10-CM | POA: Diagnosis not present

## 2016-07-25 DIAGNOSIS — R1312 Dysphagia, oropharyngeal phase: Secondary | ICD-10-CM | POA: Diagnosis not present

## 2016-07-25 DIAGNOSIS — M24571 Contracture, right ankle: Secondary | ICD-10-CM | POA: Diagnosis not present

## 2016-07-26 ENCOUNTER — Ambulatory Visit (HOSPITAL_COMMUNITY)
Admission: RE | Admit: 2016-07-26 | Discharge: 2016-07-26 | Disposition: A | Discharge status: Home / Self Care | Payer: Medicare Other | Source: Ambulatory Visit | Attending: Internal Medicine | Admitting: Internal Medicine

## 2016-07-26 DIAGNOSIS — R1319 Other dysphagia: Secondary | ICD-10-CM | POA: Diagnosis not present

## 2016-07-26 DIAGNOSIS — R1310 Dysphagia, unspecified: Secondary | ICD-10-CM | POA: Diagnosis not present

## 2016-07-26 DIAGNOSIS — R633 Feeding difficulties: Secondary | ICD-10-CM | POA: Diagnosis not present

## 2016-07-26 DIAGNOSIS — M24572 Contracture, left ankle: Secondary | ICD-10-CM | POA: Diagnosis not present

## 2016-07-26 DIAGNOSIS — M24571 Contracture, right ankle: Secondary | ICD-10-CM | POA: Diagnosis not present

## 2016-07-26 DIAGNOSIS — R4182 Altered mental status, unspecified: Secondary | ICD-10-CM | POA: Diagnosis not present

## 2016-07-26 DIAGNOSIS — R1312 Dysphagia, oropharyngeal phase: Secondary | ICD-10-CM | POA: Diagnosis not present

## 2016-07-27 DIAGNOSIS — R1312 Dysphagia, oropharyngeal phase: Secondary | ICD-10-CM | POA: Diagnosis not present

## 2016-07-27 DIAGNOSIS — M24571 Contracture, right ankle: Secondary | ICD-10-CM | POA: Diagnosis not present

## 2016-07-27 DIAGNOSIS — M24572 Contracture, left ankle: Secondary | ICD-10-CM | POA: Diagnosis not present

## 2016-07-28 DIAGNOSIS — M24572 Contracture, left ankle: Secondary | ICD-10-CM | POA: Diagnosis not present

## 2016-07-28 DIAGNOSIS — R1312 Dysphagia, oropharyngeal phase: Secondary | ICD-10-CM | POA: Diagnosis not present

## 2016-07-28 DIAGNOSIS — M24571 Contracture, right ankle: Secondary | ICD-10-CM | POA: Diagnosis not present

## 2016-07-31 DIAGNOSIS — R1312 Dysphagia, oropharyngeal phase: Secondary | ICD-10-CM | POA: Diagnosis not present

## 2016-07-31 DIAGNOSIS — M24571 Contracture, right ankle: Secondary | ICD-10-CM | POA: Diagnosis not present

## 2016-07-31 DIAGNOSIS — M24572 Contracture, left ankle: Secondary | ICD-10-CM | POA: Diagnosis not present

## 2016-08-01 DIAGNOSIS — M24572 Contracture, left ankle: Secondary | ICD-10-CM | POA: Diagnosis not present

## 2016-08-01 DIAGNOSIS — R1312 Dysphagia, oropharyngeal phase: Secondary | ICD-10-CM | POA: Diagnosis not present

## 2016-08-01 DIAGNOSIS — M24571 Contracture, right ankle: Secondary | ICD-10-CM | POA: Diagnosis not present

## 2016-08-02 DIAGNOSIS — M24572 Contracture, left ankle: Secondary | ICD-10-CM | POA: Diagnosis not present

## 2016-08-02 DIAGNOSIS — M24571 Contracture, right ankle: Secondary | ICD-10-CM | POA: Diagnosis not present

## 2016-08-02 DIAGNOSIS — R1312 Dysphagia, oropharyngeal phase: Secondary | ICD-10-CM | POA: Diagnosis not present

## 2016-08-03 DIAGNOSIS — R1312 Dysphagia, oropharyngeal phase: Secondary | ICD-10-CM | POA: Diagnosis not present

## 2016-08-03 DIAGNOSIS — M24571 Contracture, right ankle: Secondary | ICD-10-CM | POA: Diagnosis not present

## 2016-08-03 DIAGNOSIS — M24572 Contracture, left ankle: Secondary | ICD-10-CM | POA: Diagnosis not present

## 2016-08-04 DIAGNOSIS — M24571 Contracture, right ankle: Secondary | ICD-10-CM | POA: Diagnosis not present

## 2016-08-04 DIAGNOSIS — R1312 Dysphagia, oropharyngeal phase: Secondary | ICD-10-CM | POA: Diagnosis not present

## 2016-08-04 DIAGNOSIS — M24572 Contracture, left ankle: Secondary | ICD-10-CM | POA: Diagnosis not present

## 2016-08-07 DIAGNOSIS — R1312 Dysphagia, oropharyngeal phase: Secondary | ICD-10-CM | POA: Diagnosis not present

## 2016-08-07 DIAGNOSIS — M24571 Contracture, right ankle: Secondary | ICD-10-CM | POA: Diagnosis not present

## 2016-08-07 DIAGNOSIS — M24572 Contracture, left ankle: Secondary | ICD-10-CM | POA: Diagnosis not present

## 2016-08-08 DIAGNOSIS — H2513 Age-related nuclear cataract, bilateral: Secondary | ICD-10-CM | POA: Diagnosis not present

## 2016-08-08 DIAGNOSIS — M24571 Contracture, right ankle: Secondary | ICD-10-CM | POA: Diagnosis not present

## 2016-08-08 DIAGNOSIS — R1312 Dysphagia, oropharyngeal phase: Secondary | ICD-10-CM | POA: Diagnosis not present

## 2016-08-08 DIAGNOSIS — H04123 Dry eye syndrome of bilateral lacrimal glands: Secondary | ICD-10-CM | POA: Diagnosis not present

## 2016-08-08 DIAGNOSIS — H353132 Nonexudative age-related macular degeneration, bilateral, intermediate dry stage: Secondary | ICD-10-CM | POA: Diagnosis not present

## 2016-08-08 DIAGNOSIS — M24572 Contracture, left ankle: Secondary | ICD-10-CM | POA: Diagnosis not present

## 2016-08-09 DIAGNOSIS — R1312 Dysphagia, oropharyngeal phase: Secondary | ICD-10-CM | POA: Diagnosis not present

## 2016-08-09 DIAGNOSIS — M24571 Contracture, right ankle: Secondary | ICD-10-CM | POA: Diagnosis not present

## 2016-08-09 DIAGNOSIS — M24572 Contracture, left ankle: Secondary | ICD-10-CM | POA: Diagnosis not present

## 2016-08-15 DIAGNOSIS — M24572 Contracture, left ankle: Secondary | ICD-10-CM | POA: Diagnosis not present

## 2016-08-15 DIAGNOSIS — M24571 Contracture, right ankle: Secondary | ICD-10-CM | POA: Diagnosis not present

## 2016-08-22 ENCOUNTER — Encounter: Payer: Self-pay | Admitting: Adult Health

## 2016-08-22 ENCOUNTER — Non-Acute Institutional Stay (SKILLED_NURSING_FACILITY): Payer: Medicare Other | Admitting: Adult Health

## 2016-08-22 DIAGNOSIS — I69398 Other sequelae of cerebral infarction: Secondary | ICD-10-CM | POA: Diagnosis not present

## 2016-08-22 DIAGNOSIS — G8191 Hemiplegia, unspecified affecting right dominant side: Secondary | ICD-10-CM

## 2016-08-22 DIAGNOSIS — R1310 Dysphagia, unspecified: Secondary | ICD-10-CM | POA: Diagnosis not present

## 2016-08-22 DIAGNOSIS — F02818 Dementia in other diseases classified elsewhere, unspecified severity, with other behavioral disturbance: Secondary | ICD-10-CM

## 2016-08-22 DIAGNOSIS — T402X5A Adverse effect of other opioids, initial encounter: Secondary | ICD-10-CM | POA: Diagnosis not present

## 2016-08-22 DIAGNOSIS — F0281 Dementia in other diseases classified elsewhere with behavioral disturbance: Secondary | ICD-10-CM

## 2016-08-22 DIAGNOSIS — F419 Anxiety disorder, unspecified: Secondary | ICD-10-CM | POA: Diagnosis not present

## 2016-08-22 DIAGNOSIS — G40909 Epilepsy, unspecified, not intractable, without status epilepticus: Secondary | ICD-10-CM

## 2016-08-22 DIAGNOSIS — F329 Major depressive disorder, single episode, unspecified: Secondary | ICD-10-CM | POA: Diagnosis not present

## 2016-08-22 DIAGNOSIS — E785 Hyperlipidemia, unspecified: Secondary | ICD-10-CM

## 2016-08-22 DIAGNOSIS — G894 Chronic pain syndrome: Secondary | ICD-10-CM

## 2016-08-22 DIAGNOSIS — K5903 Drug induced constipation: Secondary | ICD-10-CM

## 2016-08-22 DIAGNOSIS — M24573 Contracture, unspecified ankle: Secondary | ICD-10-CM | POA: Diagnosis not present

## 2016-08-22 NOTE — Progress Notes (Signed)
Patient ID: Joshua Munoz, male   DOB: 07/06/1951, 65 y.o.   MRN: [id number]   DATE:     08/22/16  Facility:  Nursing Home Location:  [facility] Room Number: [id number] LEVEL OF CARE:  SNF (31)   Chief Complaint  Patient presents with  . Medical Management of Chronic Issues    HISTORY OF PRESENT ILLNESS:  This is a 65 year old male who is being seen for a routine visit. He is a [facility]. He is currently having PT for contracture of bilateral ankles. ST services were discontinued. He had MBS which showed severe pharyngeal dysphagia. He is a high aspiration risk. Family does not want feeding tubes.   PAST MEDICAL HISTORY:  Past Medical History:  Diagnosis Date  . Anxiety   . Cerebral trauma (HCC)   . Constipation   . Contracture of elbow   . 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 Depression   . Dry eye syndrome   . Dysphagia   . Generalized pain   . Hemiplegia affecting right dominant side (HCC)   . HLD (hyperlipidemia)   . Protein calorie malnutrition (HCC)   . Right humeral fracture   . Seizures (HCC)   . Therapeutic opioid induced constipation   . Urinary tract infection without hematuria   . Vascular dementia without behavioral disturbance     CURRENT MEDICATIONS: Reviewed per MAR/see medication list   Medication List       Accurate as of 08/22/16 11:59 PM. Always use your most recent med list.          acetaminophen 325 MG tablet Commonly known as:  TYLENOL Take 650 mg by mouth every 6 (six) hours as needed for mild pain.   atorvastatin 10 MG tablet Commonly known as:  LIPITOR Take 10 mg by mouth at bedtime.   bisacodyl 10 MG suppository Commonly known as:  DULCOLAX Place 10 mg rectally every other day. Give at bedtime every other day   carbamazepine 200 MG tablet Commonly known as:  TEGRETOL Take 300 mg by mouth 3 (three) times daily. 1-1/2 tablets to = 300 mg     CERTAVITE/ANTIOXIDANTS PO Take 1 tablet by mouth daily.   citalopram 10 MG tablet Commonly known as:  CELEXA Take 10 mg by mouth daily.   HYDROcodone-acetaminophen 5-325 MG tablet Commonly known as:  NORCO/VICODIN Take one tablet by mouth once daily for pain. Do not exceed 4gm of Tylenol in 24 hours   ketoconazole 2 % shampoo Commonly known as:  NIZORAL Apply 1 application topically. Apply to scalp on shower days   LORazepam 0.5 MG tablet Commonly known as:  ATIVAN Take 0.25 mg by mouth. Give one half tab= 0.25 mg by mouth daily, may give 0.25 mg daily PRN also   PHENobarbital 97.2 MG tablet Commonly known as:  LUMINAL Take one tablet by mouth every night at bedtime   polyethylene glycol packet Commonly known as:  MIRALAX / GLYCOLAX Take 17 g by mouth 2 (two) times daily.   sennosides-docusate sodium 8.6-50 MG tablet Commonly known as:  SENOKOT-S Take 2 tablets by mouth 2 (two) times daily. For constipation   SYSTANE 0.4-0.3 % Soln Generic drug:  Polyethyl Glycol-Propyl Glycol Apply 1 drop to eye 4 (four) times daily. Left eye   UNABLE TO FIND Med Name: Magic Cup BID as snack at [facility]PM and 8PM for nutritional support and increased protein  No Known Allergies   REVIEW OF SYSTEMS:  Unobtainable due to advanced dementia  PHYSICAL EXAMINATION  GENERAL: no acute distress, normal body habitus SKIN:  Skin is warm to touch. EYES: conjunctivae normal, sclerae normal, normal eye lids NECK: supple, trachea midline, no neck masses, no thyroid tenderness, no thyromegaly LYMPHATICS: no LAN in the neck, no supraclavicular LAN RESPIRATORY: breathing is even & unlabored, BS CTAB CARDIAC: RRR, no murmur,no extra heart sounds, no edema GI: abdomen soft, normal BS, no masses, no tenderness, no hepatomegaly, no splenomegaly EXTREMITIES:  Right hemiplegia; LUE full ROM, 5/5 strength; RUE contracted; BLE no movement; uses geri-chair; contracted bilateral ankles NEURO:  Slurred  speech, right hemiplegia PSYCHIATRIC: he has good eye contact,  affect & behavior appropriate  LABS/RADIOLOGY: Labs reviewed: 07/18/16  Na 141  K 5.0  CO2 27.6  Glucose 89  BUN 12  Creatinine 0.69  Ca 8.7  GFR >60 06/21/16  Na 140  K 4.7  CO2 31.4  Glucose 121  Creatinine 0.57  Ca 8.3  GFR >60  Carbamazepine 4.0   Phenobarbital 18.5 11/03/15  Carbamazepine 6.6   Phenobarbital 21.9 Basic Metabolic Panel:  Recent Labs  78/46/9604/25/17 03/21/16 07/18/16  NA 139 135* 141  K 4.3 4.3 5.0  BUN 9 12 12   CREATININE 0.7 0.6 0.7   Liver Function Tests:  Recent Labs  11/22/15 01/04/16 03/21/16  AST 16 13* 13*  ALT 12 15 10   ALKPHOS 99 106 91   CBC:  Recent Labs  11/22/15 03/21/16 07/18/16  WBC 6.1 5.9 5.5  NEUTROABS  --  4 4  HGB 13.7 12.9* 13.7  HCT 42 40* 43  PLT 243 178 176     ASSESSMENT/PLAN:  Dysphagia - no feeding tubes per family request; aspiration precaution  Hyperlipidemia - continue Atorvastatin 10 mg Q HS Lab Results  Component Value Date   CHOL 219 (A) 01/04/2016   HDL 56 01/04/2016   LDLCALC 139 01/04/2016   TRIG 120 01/04/2016    Depression - mood is stable; continue Celexa 10 mg by mouth daily; followed-up by [facility] for psychiatric care  Anxiety - mood is stable; continue Ativan 0.25 mg by mouth daily and QD PRN  Dementia - advanced, continue supportive care  Seizure - no recent seizure episode; continue Tegretol 300 mg 3 times a day and phenobarbital 97.2 mg daily at bedtime  Therapeutic Opioid constipation - continue senna S 2 tabs by mouth twice a day and Dulcolax suppository rectally every other HS  Right Hemiplegia -  continue supportive care; fall precaution, right hand splint  Chronic pain - well-controlled; continue  Norco 5/325 mg 1 tab daily and Acetaminophen 325 mg 2 tabs = 650 mg by mouth every 6 hours when necessary  Bilateral ankle contracture - continue OT for therapeutic exercise     Goals of care:  Long-term care    Kenard GowerMonina  Medina-Vargas, NP [facility] [phone]

## 2016-08-25 DIAGNOSIS — M24571 Contracture, right ankle: Secondary | ICD-10-CM | POA: Diagnosis not present

## 2016-08-25 DIAGNOSIS — M24572 Contracture, left ankle: Secondary | ICD-10-CM | POA: Diagnosis not present

## 2016-08-29 DIAGNOSIS — G40901 Epilepsy, unspecified, not intractable, with status epilepticus: Secondary | ICD-10-CM | POA: Diagnosis not present

## 2016-08-31 DIAGNOSIS — M24571 Contracture, right ankle: Secondary | ICD-10-CM | POA: Diagnosis not present

## 2016-08-31 DIAGNOSIS — M24572 Contracture, left ankle: Secondary | ICD-10-CM | POA: Diagnosis not present

## 2016-09-07 DIAGNOSIS — M24571 Contracture, right ankle: Secondary | ICD-10-CM | POA: Diagnosis not present

## 2016-09-07 DIAGNOSIS — M24572 Contracture, left ankle: Secondary | ICD-10-CM | POA: Diagnosis not present

## 2016-09-15 ENCOUNTER — Non-Acute Institutional Stay (SKILLED_NURSING_FACILITY): Payer: Medicare Other | Admitting: Internal Medicine

## 2016-09-15 ENCOUNTER — Encounter: Payer: Self-pay | Admitting: Internal Medicine

## 2016-09-15 DIAGNOSIS — G8191 Hemiplegia, unspecified affecting right dominant side: Secondary | ICD-10-CM

## 2016-09-15 DIAGNOSIS — E7800 Pure hypercholesterolemia, unspecified: Secondary | ICD-10-CM

## 2016-09-15 DIAGNOSIS — F329 Major depressive disorder, single episode, unspecified: Secondary | ICD-10-CM

## 2016-09-15 DIAGNOSIS — G40909 Epilepsy, unspecified, not intractable, without status epilepticus: Secondary | ICD-10-CM | POA: Diagnosis not present

## 2016-09-15 DIAGNOSIS — R1319 Other dysphagia: Secondary | ICD-10-CM

## 2016-09-15 NOTE — Progress Notes (Signed)
Patient ID: Joshua Munoz, male   DOB: Aug 14, 1951, 66 y.o.   MRN: [id number]     [facility]  No Known Allergies   Chief Complaint  Patient presents with  . Medical Management of Chronic Issues    Routine Visit    Code status: DNR  Advanced Directives 08/22/2016  Does Patient Have a Medical Advance Directive? Yes  Type of Advance Directive Out of facility DNR (pink MOST or yellow form)  Does patient want to make changes to medical advance directive? No - Patient declined  Copy of Healthcare Power of Attorney in Chart? No - copy requested  Pre-existing out of facility DNR order (yellow form or pink MOST form) -     HPI 66 y/o male patient is seen today for routine visit. He does not participate in HPI and ROS. No fall reported. No pressure ulcer reported. He is under total care. No new concerns from nursing staff.   ROS Unable to obtain  PAST MEDICAL HISTORY:  Past Medical History:  Diagnosis Date  . Anxiety   . Cerebral trauma (HCC)   . Constipation   . Contracture of elbow   . 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 Depression   . Dry eye syndrome   . Dysphagia   . Generalized pain   . Hemiplegia affecting right dominant side (HCC)   . HLD (hyperlipidemia)   . Protein calorie malnutrition (HCC)   . Right humeral fracture   . Seizures (HCC)   . Therapeutic opioid induced constipation   . Urinary tract infection without hematuria   . Vascular dementia without behavioral disturbance    Allergies as of 09/15/2016   No Known Allergies     Medication List       Accurate as of 09/15/16  2:33 PM. Always use your most recent med list.          acetaminophen 325 MG tablet Commonly known as:  TYLENOL Take 650 mg by mouth every 6 (six) hours as needed for mild pain.   atorvastatin 10 MG tablet Commonly known as:  LIPITOR Take 10 mg by mouth at bedtime.   bisacodyl 10 MG suppository Commonly known  as:  DULCOLAX Place 10 mg rectally every other day. Give at bedtime every other day   carbamazepine 200 MG tablet Commonly known as:  TEGRETOL Take 300 mg by mouth 3 (three) times daily. 1-1/2 tablets to = 300 mg   CERTAVITE/ANTIOXIDANTS PO Take 1 tablet by mouth daily.   citalopram 10 MG tablet Commonly known as:  CELEXA Take 10 mg by mouth daily.   HYDROcodone-acetaminophen 5-325 MG tablet Commonly known as:  NORCO/VICODIN Take one tablet by mouth once daily for pain. Do not exceed 4gm of Tylenol in 24 hours   ketoconazole 2 % shampoo Commonly known as:  NIZORAL Apply 1 application topically. Apply to scalp on shower days   LORazepam 0.5 MG tablet Commonly known as:  ATIVAN Take 0.25 mg by mouth. Give one half tab= 0.25 mg by mouth daily, may give 0.25 mg daily PRN also   PHENobarbital 97.2 MG tablet Commonly known as:  LUMINAL Take one tablet by mouth every night at bedtime   polyethylene glycol packet Commonly known as:  MIRALAX / GLYCOLAX Take 17 g by mouth 2 (two) times daily.   REFRESH LACRI-LUBE Oint Apply 1 application to eye at bedtime.   sennosides-docusate sodium 8.6-50 MG tablet Commonly known as:  SENOKOT-S  Take 2 tablets by mouth 2 (two) times daily. For constipation   SYSTANE 0.4-0.3 % Soln Generic drug:  Polyethyl Glycol-Propyl Glycol Apply 1 drop to eye 4 (four) times daily. Left eye   UNABLE TO FIND Med Name: Magic Cup BID as snack at [facility]2PM and 8PM for nutritional support and increased protein        Physical exam BP 105/86   Pulse 92   Temp 98.4 F (36.9 C) (Oral)   Resp 18   Ht 6\' 3"  (1.905 m)   Wt 160 lb 6.4 oz (72.8 kg)   SpO2 98%   BMI 20.05 kg/m   Wt Readings from Last 3 Encounters:  09/15/16 160 lb 6.4 oz (72.8 kg)  08/22/16 160 lb 12.8 oz (72.9 kg)  07/19/16 160 lb 12.8 oz (72.9 kg)   General- elderly male, thin built and frail and in no acute distress Head- atraumatic, normocephalic Eyes- PERRLA, EOMI, no pallor, no  icterus Neck- no lymphadenopathy Cardiovascular- normal s1,s2, no murmur, no leg edema Respiratory- bilateral clear to auscultation, no wheeze, no rhonchi, no crackles Abdomen- bowel sounds present, soft, non tender, condom catheter in place Musculoskeletal- right hand and elbow contracture, right sided hemiplegia, can move his LUE, bilateral foot drop Psychiatry- alert and oriented to self   Labs reviewed  CBC Latest Ref Rng & Units 07/18/2016 03/21/2016 11/22/2015  WBC 10:3/mL 5.5 5.9 6.1  Hemoglobin 13.5 - 17.5 g/dL 16.113.7 12.9(A) 13.7  Hematocrit 41 - 53 % 43 40(A) 42  Platelets 150 - 399 K/L 176 178 243    CMP Latest Ref Rng & Units 07/18/2016 03/21/2016 01/04/2016  BUN 4 - 21 mg/dL 12 12 9   Creatinine 0.6 - 1.3 mg/dL 0.7 0.6 0.7  Sodium 096137 - 147 mmol/L 141 135(A) 139  Potassium 3.4 - 5.3 mmol/L 5.0 4.3 4.3  Alkaline Phos 25 - 125 U/L - 91 106  AST 14 - 40 U/L - 13(A) 13(A)  ALT 10 - 40 U/L - 10 15   Lipid Panel     Component Value Date/Time   CHOL 219 (A) 01/04/2016   TRIG 120 01/04/2016   HDL 56 01/04/2016   LDLCALC 139 01/04/2016    Lab Results  Component Value Date   HGBA1C 5.0 01/04/2016    Lab Results  Component Value Date   TSH 2.68 01/04/2016      Assessment/Plan  Hyperlipidemia continue atorvastatin and check lipid panel on yearly basis.   Seizure disorder Remains seizure-free. Continue phenobarbital current regimen and carbamazepine has history of traumatic brain injury.  Chronic depression Mood appears stable. Currently on Celexa 10 mg daily.   Dysphagia post traumatic brain injury Continue pured diet with honey thickened liquids. Aspiration precautions to maximize safety. Full assistance with meals to be provided.   Right hemiplegia Currently on norco 5-325 mg daily and tylenol 650 mg q6h prn pain. Monitor. Supportive care to be provided and pressure ulcer prophylaxis.     Oneal GroutMAHIMA Ranell Finelli, MD Internal Medicine [facility] [address] [place], [place] [address] Cell Phone (Monday-Friday 8 am - 5 pm): [phone] On Call: [phone] and follow prompts after 5 pm and on weekends Office Phone: [phone] Office Fax: [phone]

## 2016-09-19 DIAGNOSIS — I739 Peripheral vascular disease, unspecified: Secondary | ICD-10-CM | POA: Diagnosis not present

## 2016-09-19 DIAGNOSIS — B351 Tinea unguium: Secondary | ICD-10-CM | POA: Diagnosis not present

## 2016-09-19 DIAGNOSIS — I70203 Unspecified atherosclerosis of native arteries of extremities, bilateral legs: Secondary | ICD-10-CM | POA: Diagnosis not present

## 2016-09-19 DIAGNOSIS — M79674 Pain in right toe(s): Secondary | ICD-10-CM | POA: Diagnosis not present

## 2016-09-20 DIAGNOSIS — E7800 Pure hypercholesterolemia, unspecified: Secondary | ICD-10-CM | POA: Insufficient documentation

## 2016-09-27 DIAGNOSIS — F339 Major depressive disorder, recurrent, unspecified: Secondary | ICD-10-CM | POA: Diagnosis not present

## 2016-09-27 DIAGNOSIS — F419 Anxiety disorder, unspecified: Secondary | ICD-10-CM | POA: Diagnosis not present

## 2016-09-27 DIAGNOSIS — F0150 Vascular dementia without behavioral disturbance: Secondary | ICD-10-CM | POA: Diagnosis not present

## 2016-10-12 ENCOUNTER — Non-Acute Institutional Stay (SKILLED_NURSING_FACILITY): Payer: Medicare Other | Admitting: Adult Health

## 2016-10-12 ENCOUNTER — Encounter: Payer: Self-pay | Admitting: Adult Health

## 2016-10-12 DIAGNOSIS — T402X5A Adverse effect of other opioids, initial encounter: Secondary | ICD-10-CM | POA: Diagnosis not present

## 2016-10-12 DIAGNOSIS — K5903 Drug induced constipation: Secondary | ICD-10-CM | POA: Diagnosis not present

## 2016-10-12 DIAGNOSIS — G8191 Hemiplegia, unspecified affecting right dominant side: Secondary | ICD-10-CM | POA: Diagnosis not present

## 2016-10-12 DIAGNOSIS — G40909 Epilepsy, unspecified, not intractable, without status epilepticus: Secondary | ICD-10-CM

## 2016-10-12 DIAGNOSIS — G894 Chronic pain syndrome: Secondary | ICD-10-CM | POA: Diagnosis not present

## 2016-10-12 DIAGNOSIS — R1310 Dysphagia, unspecified: Secondary | ICD-10-CM

## 2016-10-12 NOTE — Progress Notes (Signed)
DATE:  10/12/2016   MRN:  [id number]  BIRTHDAY: 03-26-1951  Facility:  Nursing Home Location:  [facility]  Nursing Home Room Number: 404-A  LEVEL OF CARE:  SNF [phone])  Contact Information    Name Relation Home Work Mobile   McFadden,[name] Sister [phone]  [phone]   Daleen Bo [phone]  [phone]       Code Status History    This patient does not have a recorded code status. Please follow your organizational policy for patients in this situation.    Advance Directive Documentation   Flowsheet Row Most Recent Value  Type of Advance Directive  Out of facility DNR (pink MOST or yellow form)  Pre-existing out of facility DNR order (yellow form or pink MOST form)  No data  "MOST" Form in Place?  No data       Chief Complaint  Patient presents with  . Medical Management of Chronic Issues    HISTORY OF PRESENT ILLNESS:  This is a 51-YO male seen for a routine visit.  He is a long-term care resident at [facility]. He was seen in the room today and was able to follow simple commands. He was smiling. No reported concerns from charge nurse and CNA.    PAST MEDICAL HISTORY:  Past Medical History:  Diagnosis Date  . Anxiety   . Cerebral trauma (HCC)   . Constipation   . Contracture of elbow   . 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 Depression   . Dry eye syndrome   . Dysphagia   . Generalized pain   . Hemiplegia affecting right dominant side (HCC)   . HLD (hyperlipidemia)   . Protein calorie malnutrition (HCC)   . Right humeral fracture   . Seizures (HCC)   . Therapeutic opioid induced constipation   . Urinary tract infection without hematuria   . Vascular dementia without behavioral disturbance      CURRENT MEDICATIONS: Reviewed  Patient's Medications  New Prescriptions   No medications on file  Previous Medications   ACETAMINOPHEN (TYLENOL) 325 MG TABLET     Take 650 mg by mouth every 6 (six) hours as needed for mild pain.    ARTIFICIAL TEAR OINTMENT (REFRESH LACRI-LUBE) OINT    Apply 1 application to eye at bedtime.   ATORVASTATIN (LIPITOR) 10 MG TABLET    Take 10 mg by mouth at bedtime.    BISACODYL (DULCOLAX) 10 MG SUPPOSITORY    Place 10 mg rectally every other day. Give at bedtime every other day   CARBAMAZEPINE PO    Take 300 mg by mouth 3 (three) times daily.   CITALOPRAM (CELEXA) 10 MG TABLET    Take 10 mg by mouth daily.    HYDROCODONE-ACETAMINOPHEN (NORCO/VICODIN) 5-325 MG PER TABLET    Take one tablet by mouth once daily for pain. Do not exceed 4gm of Tylenol in 24 hours   KETOCONAZOLE (NIZORAL) 2 % SHAMPOO    Apply 1 application topically. Apply to scalp on shower days   LORAZEPAM (ATIVAN) 0.5 MG TABLET    Take 0.25 mg by mouth. Give one half tab= 0.25 mg by mouth daily, may give 0.25 mg daily PRN also   MULTIPLE VITAMINS-MINERALS (CERTAVITE/ANTIOXIDANTS PO)    Take 1 tablet by mouth daily.    MULTIVITAMIN-LUTEIN (OCUVITE-LUTEIN) CAPS CAPSULE    Take 1 capsule by mouth 2 (two) times daily.   PHENOBARBITAL (LUMINAL) 97.2 MG TABLET  Take one tablet by mouth every night at bedtime   POLYETHYL GLYCOL-PROPYL GLYCOL (SYSTANE) 0.4-0.3 % SOLN    Apply 1 drop to eye 4 (four) times daily. Left eye   POLYETHYLENE GLYCOL (MIRALAX / GLYCOLAX) PACKET    Take 17 g by mouth 2 (two) times daily.    SENNOSIDES-DOCUSATE SODIUM (SENOKOT-S) 8.6-50 MG TABLET    Take 2 tablets by mouth 2 (two) times daily. For constipation   UNABLE TO FIND    Med Name: Magic Cup BID as snack at [facility]2PM and 8PM for nutritional support and increased protein  Modified Medications   No medications on file  Discontinued Medications   CARBAMAZEPINE (TEGRETOL) 200 MG TABLET    Take 300 mg by mouth 3 (three) times daily. 1-1/2 tablets to = 300 mg     No Known Allergies   REVIEW OF SYSTEMS:  Unable to obtain    PHYSICAL EXAMINATION  GENERAL APPEARANCE: In no acute distress.    SKIN:  Skin is warm and dry.  HEAD: Normal in size and contour. No evidence of trauma EYES: Lids open and close normally. No blepharitis, entropion or ectropion. PERRL. Conjunctivae are clear and sclerae are white. Lenses are without opacity EARS: Pinnae are normal. Patient hears normal voice tunes of the examiner MOUTH and THROAT: Lips are without lesions. Oral mucosa is moist and without lesions. Tongue is normal in shape, size, and color and without lesions NECK: supple, trachea midline, no neck masses, no thyroid tenderness, no thyromegaly LYMPHATICS: no LAN in the neck, no supraclavicular LAN RESPIRATORY: breathing is even & unlabored, BS CTAB CARDIAC: RRR, no murmur,no extra heart sounds, no edema GI: abdomen soft, normal BS, no masses, no tenderness, no hepatomegaly, no splenomegaly EXTREMITIES:  Can only move LUE, right hemiplegia on upper extremities; BLE unable to move PSYCHIATRIC: Alert to self, disoriented to time and place. Affect and behavior are appropriate   LABS/RADIOLOGY: Labs reviewed: Basic Metabolic Panel:  Recent Labs  45/40/9804/25/17 03/21/16 07/18/16  NA 139 135* 141  K 4.3 4.3 5.0  BUN 9 12 12   CREATININE 0.7 0.6 0.7   Liver Function Tests:  Recent Labs  11/22/15 01/04/16 03/21/16  AST 16 13* 13*  ALT 12 15 10   ALKPHOS 99 106 91    CBC:  Recent Labs  11/22/15 03/21/16 07/18/16  WBC 6.1 5.9 5.5  NEUTROABS  --  4 4  HGB 13.7 12.9* 13.7  HCT 42 40* 43  PLT 243 178 176   Lipid Panel:  Recent Labs  01/04/16  HDL 56    ASSESSMENT/PLAN:  Dysphagia - no feeding tubes per family request; aspiration precaution  Seizure - no recent seizure episode; continue Tegretol 300 mg 3 times a day and phenobarbital 97.2 mg daily at bedtime; check CBC  Therapeutic Opioid constipation - continue senna S 2 tabs by mouth twice a day and Dulcolax suppository rectally every other HS; check CMP  Right Hemiplegia -  continue supportive care; fall precaution, right  hand splinting  Chronic pain - well-controlled; continue  Norco 5/325 mg 1 tab daily and Acetaminophen 325 mg 2 tabs = 650 mg by mouth every 6 hours when necessary    Goals of care:  Long-term care    Jatziry Wechter C. Medina-Vargas - NP [facility] [phone]

## 2016-10-13 DIAGNOSIS — I10 Essential (primary) hypertension: Secondary | ICD-10-CM | POA: Diagnosis not present

## 2016-10-13 LAB — HEPATIC FUNCTION PANEL
ALT: 17 U/L (ref 10–40)
AST: 18 U/L (ref 14–40)
Alkaline Phosphatase: 88 U/L (ref 25–125)
BILIRUBIN, TOTAL: 0.4 mg/dL

## 2016-10-13 LAB — BASIC METABOLIC PANEL
BUN: 17 mg/dL (ref 4–21)
Creatinine: 0.6 mg/dL (ref 0.6–1.3)
GLUCOSE: 88 mg/dL
Potassium: 4.7 mmol/L (ref 3.4–5.3)
SODIUM: 140 mmol/L (ref 137–147)

## 2016-10-13 LAB — CBC AND DIFFERENTIAL
HCT: 40 % — AB (ref 41–53)
Hemoglobin: 13.1 g/dL — AB (ref 13.5–17.5)
NEUTROS ABS: 5 /uL
Platelets: 225 10*3/uL (ref 150–399)
WBC: 7.3 10^3/mL

## 2016-10-20 ENCOUNTER — Other Ambulatory Visit: Payer: Self-pay | Admitting: *Deleted

## 2016-10-20 MED ORDER — HYDROCODONE-ACETAMINOPHEN 5-325 MG PO TABS: ORAL_TABLET | ORAL | 0 refills | Status: AC

## 2016-10-20 NOTE — Telephone Encounter (Signed)
[facility] #[phone] Fax: [phone] 

## 2016-11-06 IMAGING — CR DG ELBOW 2V*R*
2 series · 2 of 2 positions shown · non-contrast
Comparison: None.

CLINICAL DATA: Acute right elbow pain following injury today.
Initial encounter.

EXAM:
RIGHT ELBOW - 2 VIEW

[x elbow ap right]
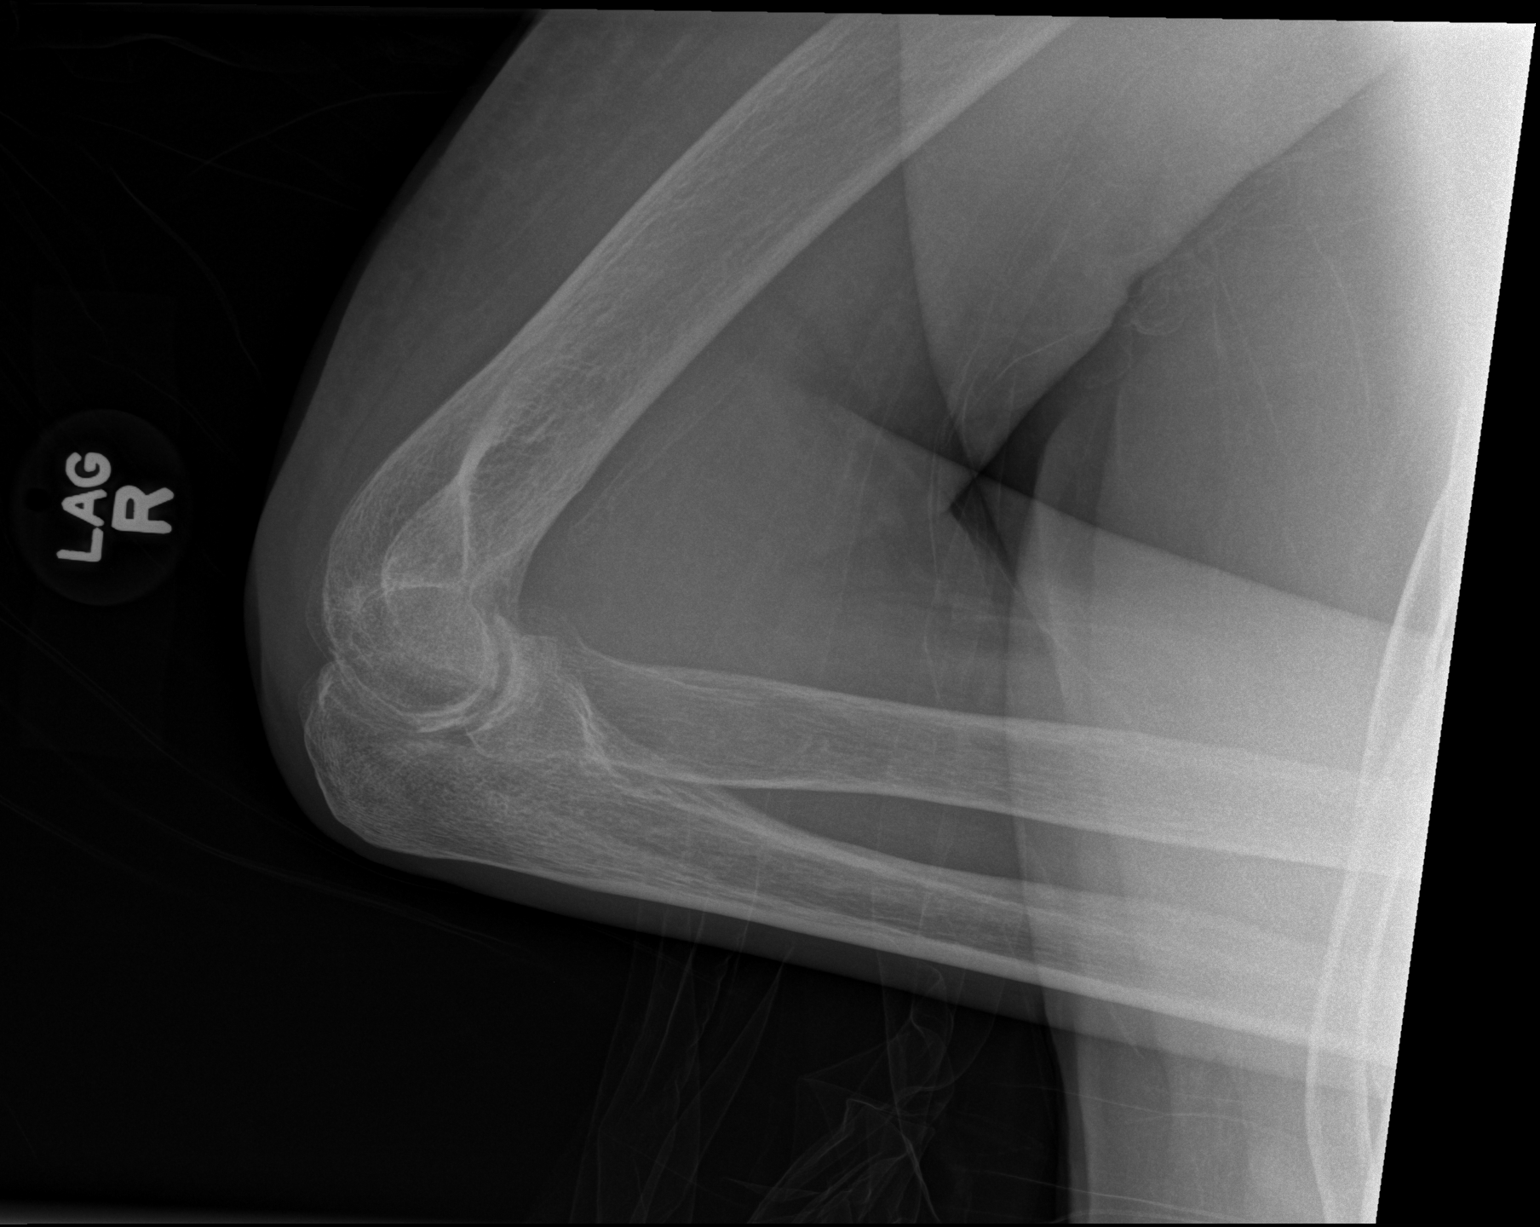

[x elbow obl right]
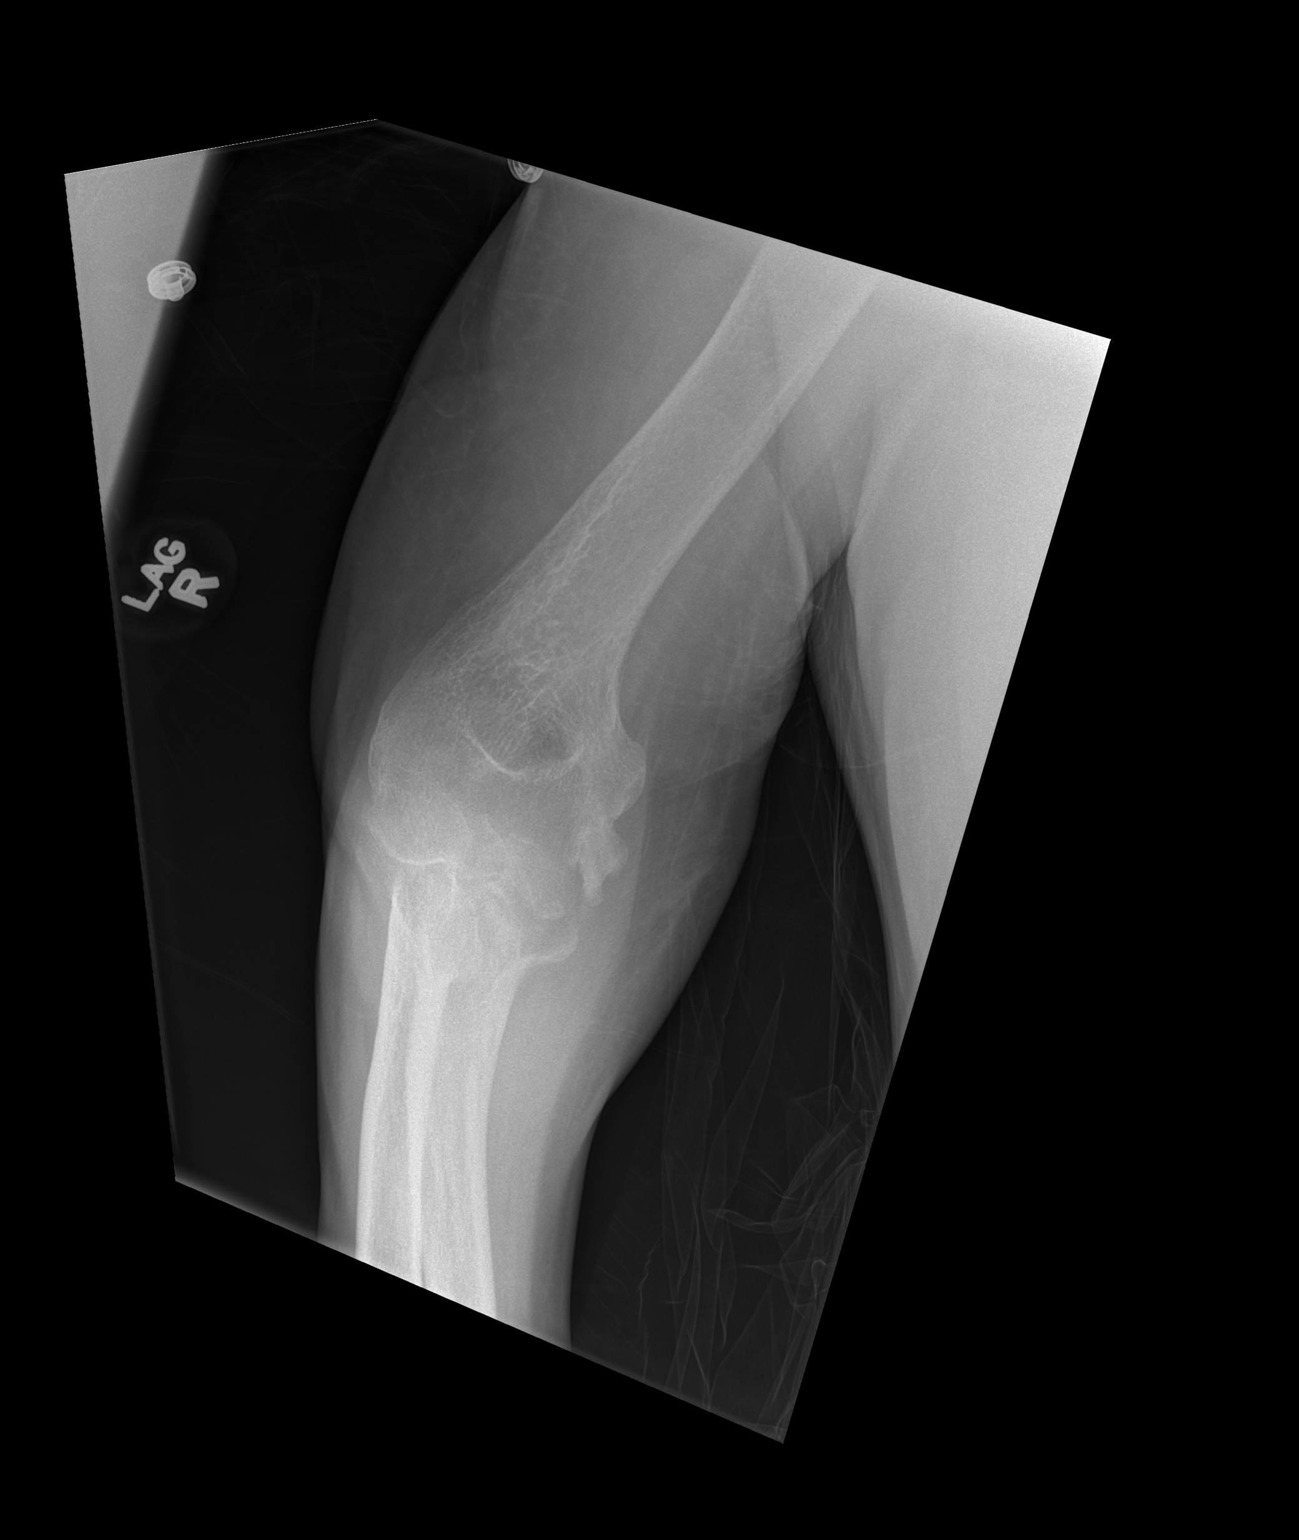

[2 of 2 positions shown; findings below may reference images not displayed]

FINDINGS: Two views of the right shoulder scratch de two views of the right
elbow demonstrate no definite acute fracture, subluxation or
dislocation.

There is no evidence of joint effusion.

Soft tissue swelling is present.
IMPRESSION: Soft tissue swelling without definite acute bony abnormality.

## 2016-11-14 ENCOUNTER — Encounter: Payer: Self-pay | Admitting: Adult Health

## 2016-11-14 ENCOUNTER — Non-Acute Institutional Stay (SKILLED_NURSING_FACILITY): Payer: Medicare Other | Admitting: Adult Health

## 2016-11-14 DIAGNOSIS — G40909 Epilepsy, unspecified, not intractable, without status epilepticus: Secondary | ICD-10-CM

## 2016-11-14 DIAGNOSIS — F329 Major depressive disorder, single episode, unspecified: Secondary | ICD-10-CM

## 2016-11-14 DIAGNOSIS — H04123 Dry eye syndrome of bilateral lacrimal glands: Secondary | ICD-10-CM | POA: Diagnosis not present

## 2016-11-14 DIAGNOSIS — F419 Anxiety disorder, unspecified: Secondary | ICD-10-CM
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
## 2016-11-16 DIAGNOSIS — Z79899 Other long term (current) drug therapy: Secondary | ICD-10-CM | POA: Diagnosis not present

## 2016-11-16 DIAGNOSIS — D500 Iron deficiency anemia secondary to blood loss (chronic): Secondary | ICD-10-CM | POA: Diagnosis not present

## 2016-11-16 LAB — LIPID PANEL
CHOLESTEROL: 156 mg/dL (ref 0–200)
HDL: 54 mg/dL (ref 35–70)
LDL CALC: 82 mg/dL
Triglycerides: 98 mg/dL (ref 40–160)

## 2016-12-20 DIAGNOSIS — F0150 Vascular dementia without behavioral disturbance: Secondary | ICD-10-CM | POA: Diagnosis not present

## 2016-12-20 DIAGNOSIS — F339 Major depressive disorder, recurrent, unspecified: Secondary | ICD-10-CM | POA: Diagnosis not present

## 2016-12-20 DIAGNOSIS — F419 Anxiety disorder, unspecified: Secondary | ICD-10-CM | POA: Diagnosis not present

## 2016-12-26 ENCOUNTER — Non-Acute Institutional Stay (SKILLED_NURSING_FACILITY): Payer: Medicare Other | Admitting: Adult Health

## 2016-12-26 ENCOUNTER — Encounter: Payer: Self-pay | Admitting: Adult Health

## 2016-12-26 DIAGNOSIS — T402X5A Adverse effect of other opioids, initial encounter: Secondary | ICD-10-CM | POA: Diagnosis not present

## 2016-12-26 DIAGNOSIS — H04123 Dry eye syndrome of bilateral lacrimal glands: Secondary | ICD-10-CM

## 2016-12-26 DIAGNOSIS — E785 Hyperlipidemia, unspecified: Secondary | ICD-10-CM | POA: Diagnosis not present

## 2016-12-26 DIAGNOSIS — K5903 Drug induced constipation: Secondary | ICD-10-CM | POA: Diagnosis not present

## 2016-12-26 DIAGNOSIS — G894 Chronic pain syndrome: Secondary | ICD-10-CM | POA: Diagnosis not present
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
## 2017-01-11 DIAGNOSIS — G8101 Flaccid hemiplegia affecting right dominant side: Secondary | ICD-10-CM | POA: Diagnosis not present

## 2017-01-11 DIAGNOSIS — R293 Abnormal posture: Secondary | ICD-10-CM | POA: Diagnosis not present

## 2017-01-16 DIAGNOSIS — R293 Abnormal posture: Secondary | ICD-10-CM | POA: Diagnosis not present

## 2017-01-16 DIAGNOSIS — G8101 Flaccid hemiplegia affecting right dominant side: Secondary | ICD-10-CM | POA: Diagnosis not present

## 2017-01-17 DIAGNOSIS — G8101 Flaccid hemiplegia affecting right dominant side: Secondary | ICD-10-CM | POA: Diagnosis not present

## 2017-01-17 DIAGNOSIS — R293 Abnormal posture: Secondary | ICD-10-CM | POA: Diagnosis not present

## 2017-01-18 DIAGNOSIS — G8101 Flaccid hemiplegia affecting right dominant side: Secondary | ICD-10-CM | POA: Diagnosis not present

## 2017-01-18 DIAGNOSIS — R293 Abnormal posture: Secondary | ICD-10-CM | POA: Diagnosis not present

## 2017-01-19 DIAGNOSIS — R293 Abnormal posture: Secondary | ICD-10-CM | POA: Diagnosis not present

## 2017-01-19 DIAGNOSIS — G8101 Flaccid hemiplegia affecting right dominant side: Secondary | ICD-10-CM | POA: Diagnosis not present

## 2017-01-22 ENCOUNTER — Non-Acute Institutional Stay (SKILLED_NURSING_FACILITY): Payer: Medicare Other | Admitting: Adult Health

## 2017-01-22 ENCOUNTER — Encounter: Payer: Self-pay | Admitting: Adult Health

## 2017-01-22 DIAGNOSIS — R293 Abnormal posture: Secondary | ICD-10-CM | POA: Diagnosis not present

## 2017-01-22 DIAGNOSIS — F419 Anxiety disorder, unspecified: Secondary | ICD-10-CM | POA: Diagnosis not present

## 2017-01-22 DIAGNOSIS — E785 Hyperlipidemia, unspecified: Secondary | ICD-10-CM

## 2017-01-22 DIAGNOSIS — G40909 Epilepsy, unspecified, not intractable, without status epilepticus: Secondary | ICD-10-CM

## 2017-01-22 DIAGNOSIS — G8101 Flaccid hemiplegia affecting right dominant side: Secondary | ICD-10-CM | POA: Diagnosis not present

## 2017-01-22 DIAGNOSIS — F341 Dysthymic disorder: Secondary | ICD-10-CM | POA: Diagnosis not present

## 2017-01-22 DIAGNOSIS — F329 Major depressive disorder, single episode, unspecified: Secondary | ICD-10-CM
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
## 2017-01-23 DIAGNOSIS — Z79899 Other long term (current) drug therapy: Secondary | ICD-10-CM | POA: Diagnosis not present

## 2017-01-25 DIAGNOSIS — R293 Abnormal posture: Secondary | ICD-10-CM | POA: Diagnosis not present

## 2017-01-25 DIAGNOSIS — G8101 Flaccid hemiplegia affecting right dominant side: Secondary | ICD-10-CM | POA: Diagnosis not present

## 2017-01-26 ENCOUNTER — Non-Acute Institutional Stay (SKILLED_NURSING_FACILITY): Payer: Medicare Other

## 2017-01-26 DIAGNOSIS — Z0000 Encounter for general adult medical examination without abnormal findings: Secondary | ICD-10-CM
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
## 2017-01-26 NOTE — Patient Instructions (Addendum)
Joshua Munoz , Thank you for taking time to come for your Medicare Wellness Visit. I appreciate your ongoing commitment to your health goals. Please review the following plan we discussed and let me know if I can assist you in the future.   Screening recommendations/referrals: Colonoscopy not due, long term pt Recommended yearly ophthalmology/optometry visit for glaucoma screening and checkup Recommended yearly dental visit for hygiene and checkup  Vaccinations: Influenza vaccine due 06/06/2017 Pneumococcal vaccine 13 due. Tdap vaccine not in records Shingles vaccine not in records  Advanced directives: In Chart  Conditions/risks identified: none  Next appointment: none upcoming  Preventive Care 8865 Years and Older, Male Preventive care refers to lifestyle choices and visits with your health care provider that can promote health and wellness. What does preventive care include?  A yearly physical exam. This is also called an annual well check.  Dental exams once or twice a year.  Routine eye exams. Ask your health care provider how often you should have your eyes checked.  Personal lifestyle choices, including:  Daily care of your teeth and gums.  Regular physical activity.  Eating a healthy diet.  Avoiding tobacco and drug use.  Limiting alcohol use.  Practicing safe sex.  Taking low doses of aspirin every day.  Taking vitamin and mineral supplements as recommended by your health care provider. What happens during an annual well check? The services and screenings done by your health care provider during your annual well check will depend on your age, overall health, lifestyle risk factors, and family history of disease. Counseling  Your health care provider may ask you questions about your:  Alcohol use.  Tobacco use.  Drug use.  Emotional well-being.  Home and relationship well-being.  Sexual activity.  Eating habits.  History of falls.  Memory and  ability to understand (cognition).  Work and work Astronomerenvironment. Screening  You may have the following tests or measurements:  Height, weight, and BMI.  Blood pressure.  Lipid and cholesterol levels. These may be checked every 5 years, or more frequently if you are over 66 years old.  Skin check.  Lung cancer screening. You may have this screening every year starting at age 66 if you have a 30-pack-year history of smoking and currently smoke or have quit within the past 15 years.  Fecal occult blood test (FOBT) of the stool. You may have this test every year starting at age 66.  Flexible sigmoidoscopy or colonoscopy. You may have a sigmoidoscopy every 5 years or a colonoscopy every 10 years starting at age 66.  Prostate cancer screening. Recommendations will vary depending on your family history and other risks.  Hepatitis C blood test.  Hepatitis B blood test.  Sexually transmitted disease (STD) testing.  Diabetes screening. This is done by checking your blood sugar (glucose) after you have not eaten for a while (fasting). You may have this done every 1-3 years.  Abdominal aortic aneurysm (AAA) screening. You may need this if you are a current or former smoker.  Osteoporosis. You may be screened starting at age 66 if you are at high risk. Talk with your health care provider about your test results, treatment options, and if necessary, the need for more tests. Vaccines  Your health care provider may recommend certain vaccines, such as:  Influenza vaccine. This is recommended every year.  Tetanus, diphtheria, and acellular pertussis (Tdap, Td) vaccine. You may need a Td booster every 10 years.  Zoster vaccine. You may need this after  age 71.  Pneumococcal 13-valent conjugate (PCV13) vaccine. One dose is recommended after age 27.  Pneumococcal polysaccharide (PPSV23) vaccine. One dose is recommended after age 3. Talk to your health care provider about which screenings and  vaccines you need and how often you need them. This information is not intended to replace advice given to you by your health care provider. Make sure you discuss any questions you have with your health care provider. Document Released: 09/24/2015 Document Revised: 05/17/2016 Document Reviewed: 06/29/2015 Elsevier Interactive Patient Education  2017 [place] Prevention in the Home Falls can cause injuries. They can happen to people of all ages. There are many things you can do to make your home safe and to help prevent falls. What can I do on the outside of my home?  Regularly fix the edges of walkways and driveways and fix any cracks.  Remove anything that might make you trip as you walk through a door, such as a raised step or threshold.  Trim any bushes or trees on the path to your home.  Use bright outdoor lighting.  Clear any walking paths of anything that might make someone trip, such as rocks or tools.  Regularly check to see if handrails are loose or broken. Make sure that both sides of any steps have handrails.  Any raised decks and porches should have guardrails on the edges.  Have any leaves, snow, or ice cleared regularly.  Use sand or salt on walking paths during winter.  Clean up any spills in your garage right away. This includes oil or grease spills. What can I do in the bathroom?  Use night lights.  Install grab bars by the toilet and in the tub and shower. Do not use towel bars as grab bars.  Use non-skid mats or decals in the tub or shower.  If you need to sit down in the shower, use a plastic, non-slip stool.  Keep the floor dry. Clean up any water that spills on the floor as soon as it happens.  Remove soap buildup in the tub or shower regularly.  Attach bath mats securely with double-sided non-slip rug tape.  Do not have throw rugs and other things on the floor that can make you trip. What can I do in the bedroom?  Use night  lights.  Make sure that you have a light by your bed that is easy to reach.  Do not use any sheets or blankets that are too big for your bed. They should not hang down onto the floor.  Have a firm chair that has side arms. You can use this for support while you get dressed.  Do not have throw rugs and other things on the floor that can make you trip. What can I do in the kitchen?  Clean up any spills right away.  Avoid walking on wet floors.  Keep items that you use a lot in easy-to-reach places.  If you need to reach something above you, use a strong step stool that has a grab bar.  Keep electrical cords out of the way.  Do not use floor polish or wax that makes floors slippery. If you must use wax, use non-skid floor wax.  Do not have throw rugs and other things on the floor that can make you trip. What can I do with my stairs?  Do not leave any items on the stairs.  Make sure that there are handrails on both sides of the stairs  and use them. Fix handrails that are broken or loose. Make sure that handrails are as long as the stairways.  Check any carpeting to make sure that it is firmly attached to the stairs. Fix any carpet that is loose or worn.  Avoid having throw rugs at the top or bottom of the stairs. If you do have throw rugs, attach them to the floor with carpet tape.  Make sure that you have a light switch at the top of the stairs and the bottom of the stairs. If you do not have them, ask someone to add them for you. What else can I do to help prevent falls?  Wear shoes that:  Do not have high heels.  Have rubber bottoms.  Are comfortable and fit you well.  Are closed at the toe. Do not wear sandals.  If you use a stepladder:  Make sure that it is fully opened. Do not climb a closed stepladder.  Make sure that both sides of the stepladder are locked into place.  Ask someone to hold it for you, if possible.  Clearly mark and make sure that you can  see:  Any grab bars or handrails.  First and last steps.  Where the edge of each step is.  Use tools that help you move around (mobility aids) if they are needed. These include:  Canes.  Walkers.  Scooters.  Crutches.  Turn on the lights when you go into a dark area. Replace any light bulbs as soon as they burn out.  Set up your furniture so you have a clear path. Avoid moving your furniture around.  If any of your floors are uneven, fix them.  If there are any pets around you, be aware of where they are.  Review your medicines with your doctor. Some medicines can make you feel dizzy. This can increase your chance of falling. Ask your doctor what other things that you can do to help prevent falls. This information is not intended to replace advice given to you by your health care provider. Make sure you discuss any questions you have with your health care provider. Document Released: 06/24/2009 Document Revised: 02/03/2016 Document Reviewed: 10/02/2014 Elsevier Interactive Patient Education  2017 [employer].

## 2017-01-30 DIAGNOSIS — R293 Abnormal posture: Secondary | ICD-10-CM | POA: Diagnosis not present

## 2017-01-30 DIAGNOSIS — G8101 Flaccid hemiplegia affecting right dominant side: Secondary | ICD-10-CM | POA: Diagnosis not present

## 2017-01-31 DIAGNOSIS — R293 Abnormal posture: Secondary | ICD-10-CM | POA: Diagnosis not present

## 2017-01-31 DIAGNOSIS — G8101 Flaccid hemiplegia affecting right dominant side: Secondary | ICD-10-CM | POA: Diagnosis not present

## 2017-02-01 DIAGNOSIS — R293 Abnormal posture: Secondary | ICD-10-CM | POA: Diagnosis not present

## 2017-02-01 DIAGNOSIS — G8101 Flaccid hemiplegia affecting right dominant side: Secondary | ICD-10-CM | POA: Diagnosis not present

## 2017-02-06 DIAGNOSIS — G8101 Flaccid hemiplegia affecting right dominant side: Secondary | ICD-10-CM | POA: Diagnosis not present

## 2017-02-06 DIAGNOSIS — R293 Abnormal posture: Secondary | ICD-10-CM | POA: Diagnosis not present

## 2017-02-07 DIAGNOSIS — G8101 Flaccid hemiplegia affecting right dominant side: Secondary | ICD-10-CM | POA: Diagnosis not present

## 2017-02-07 DIAGNOSIS — R293 Abnormal posture: Secondary | ICD-10-CM | POA: Diagnosis not present

## 2017-02-08 DIAGNOSIS — R293 Abnormal posture: Secondary | ICD-10-CM | POA: Diagnosis not present

## 2017-02-08 DIAGNOSIS — G8101 Flaccid hemiplegia affecting right dominant side: Secondary | ICD-10-CM | POA: Diagnosis not present

## 2017-02-13 DIAGNOSIS — G40901 Epilepsy, unspecified, not intractable, with status epilepticus: Secondary | ICD-10-CM | POA: Diagnosis not present

## 2017-02-13 DIAGNOSIS — G8101 Flaccid hemiplegia affecting right dominant side: Secondary | ICD-10-CM | POA: Diagnosis not present

## 2017-02-13 DIAGNOSIS — R293 Abnormal posture: Secondary | ICD-10-CM | POA: Diagnosis not present

## 2017-02-13 DIAGNOSIS — R1312 Dysphagia, oropharyngeal phase: Secondary | ICD-10-CM | POA: Diagnosis not present

## 2017-02-14 DIAGNOSIS — G8101 Flaccid hemiplegia affecting right dominant side: Secondary | ICD-10-CM | POA: Diagnosis not present

## 2017-02-14 DIAGNOSIS — R1312 Dysphagia, oropharyngeal phase: Secondary | ICD-10-CM | POA: Diagnosis not present

## 2017-02-14 DIAGNOSIS — R293 Abnormal posture: Secondary | ICD-10-CM | POA: Diagnosis not present

## 2017-02-15 DIAGNOSIS — R293 Abnormal posture: Secondary | ICD-10-CM | POA: Diagnosis not present

## 2017-02-15 DIAGNOSIS — R1312 Dysphagia, oropharyngeal phase: Secondary | ICD-10-CM | POA: Diagnosis not present

## 2017-02-15 DIAGNOSIS — G8101 Flaccid hemiplegia affecting right dominant side: Secondary | ICD-10-CM | POA: Diagnosis not present

## 2017-02-20 DIAGNOSIS — G8101 Flaccid hemiplegia affecting right dominant side: Secondary | ICD-10-CM | POA: Diagnosis not present

## 2017-02-20 DIAGNOSIS — R1312 Dysphagia, oropharyngeal phase: Secondary | ICD-10-CM | POA: Diagnosis not present

## 2017-02-20 DIAGNOSIS — R293 Abnormal posture: Secondary | ICD-10-CM | POA: Diagnosis not present

## 2017-02-21 DIAGNOSIS — R293 Abnormal posture: Secondary | ICD-10-CM | POA: Diagnosis not present

## 2017-02-21 DIAGNOSIS — G8101 Flaccid hemiplegia affecting right dominant side: Secondary | ICD-10-CM | POA: Diagnosis not present

## 2017-02-21 DIAGNOSIS — R1312 Dysphagia, oropharyngeal phase: Secondary | ICD-10-CM | POA: Diagnosis not present

## 2017-02-22 DIAGNOSIS — R1312 Dysphagia, oropharyngeal phase: Secondary | ICD-10-CM | POA: Diagnosis not present

## 2017-02-22 DIAGNOSIS — G8101 Flaccid hemiplegia affecting right dominant side: Secondary | ICD-10-CM | POA: Diagnosis not present

## 2017-02-22 DIAGNOSIS — R293 Abnormal posture: Secondary | ICD-10-CM | POA: Diagnosis not present

## 2017-02-26 DIAGNOSIS — R1312 Dysphagia, oropharyngeal phase: Secondary | ICD-10-CM | POA: Diagnosis not present

## 2017-02-26 DIAGNOSIS — G8101 Flaccid hemiplegia affecting right dominant side: Secondary | ICD-10-CM | POA: Diagnosis not present

## 2017-02-26 DIAGNOSIS — R293 Abnormal posture: Secondary | ICD-10-CM | POA: Diagnosis not present

## 2017-02-27 DIAGNOSIS — G8101 Flaccid hemiplegia affecting right dominant side: Secondary | ICD-10-CM | POA: Diagnosis not present

## 2017-02-27 DIAGNOSIS — R1312 Dysphagia, oropharyngeal phase: Secondary | ICD-10-CM | POA: Diagnosis not present

## 2017-02-27 DIAGNOSIS — R293 Abnormal posture: Secondary | ICD-10-CM | POA: Diagnosis not present

## 2017-02-28 DIAGNOSIS — G8101 Flaccid hemiplegia affecting right dominant side: Secondary | ICD-10-CM | POA: Diagnosis not present

## 2017-02-28 DIAGNOSIS — R293 Abnormal posture: Secondary | ICD-10-CM | POA: Diagnosis not present

## 2017-02-28 DIAGNOSIS — R1312 Dysphagia, oropharyngeal phase: Secondary | ICD-10-CM | POA: Diagnosis not present

## 2017-03-01 DIAGNOSIS — G8101 Flaccid hemiplegia affecting right dominant side: Secondary | ICD-10-CM | POA: Diagnosis not present

## 2017-03-01 DIAGNOSIS — R293 Abnormal posture: Secondary | ICD-10-CM | POA: Diagnosis not present

## 2017-03-01 DIAGNOSIS — R1312 Dysphagia, oropharyngeal phase: Secondary | ICD-10-CM | POA: Diagnosis not present

## 2017-03-02 DIAGNOSIS — G8101 Flaccid hemiplegia affecting right dominant side: Secondary | ICD-10-CM | POA: Diagnosis not present

## 2017-03-02 DIAGNOSIS — R293 Abnormal posture: Secondary | ICD-10-CM | POA: Diagnosis not present

## 2017-03-02 DIAGNOSIS — R1312 Dysphagia, oropharyngeal phase: Secondary | ICD-10-CM | POA: Diagnosis not present

## 2017-03-05 DIAGNOSIS — G8101 Flaccid hemiplegia affecting right dominant side: Secondary | ICD-10-CM | POA: Diagnosis not present

## 2017-03-05 DIAGNOSIS — R293 Abnormal posture: Secondary | ICD-10-CM | POA: Diagnosis not present

## 2017-03-05 DIAGNOSIS — R1312 Dysphagia, oropharyngeal phase: Secondary | ICD-10-CM | POA: Diagnosis not present

## 2017-03-06 DIAGNOSIS — G8101 Flaccid hemiplegia affecting right dominant side: Secondary | ICD-10-CM | POA: Diagnosis not present

## 2017-03-06 DIAGNOSIS — R1312 Dysphagia, oropharyngeal phase: Secondary | ICD-10-CM | POA: Diagnosis not present

## 2017-03-06 DIAGNOSIS — R293 Abnormal posture: Secondary | ICD-10-CM | POA: Diagnosis not present

## 2017-03-07 DIAGNOSIS — R293 Abnormal posture: Secondary | ICD-10-CM | POA: Diagnosis not present

## 2017-03-07 DIAGNOSIS — R1312 Dysphagia, oropharyngeal phase: Secondary | ICD-10-CM | POA: Diagnosis not present

## 2017-03-07 DIAGNOSIS — G8101 Flaccid hemiplegia affecting right dominant side: Secondary | ICD-10-CM | POA: Diagnosis not present

## 2017-03-08 DIAGNOSIS — R293 Abnormal posture: Secondary | ICD-10-CM | POA: Diagnosis not present

## 2017-03-08 DIAGNOSIS — R1312 Dysphagia, oropharyngeal phase: Secondary | ICD-10-CM | POA: Diagnosis not present

## 2017-03-08 DIAGNOSIS — G8101 Flaccid hemiplegia affecting right dominant side: Secondary | ICD-10-CM | POA: Diagnosis not present

## 2017-03-09 DIAGNOSIS — R1312 Dysphagia, oropharyngeal phase: Secondary | ICD-10-CM | POA: Diagnosis not present

## 2017-03-09 DIAGNOSIS — R293 Abnormal posture: Secondary | ICD-10-CM | POA: Diagnosis not present

## 2017-03-09 DIAGNOSIS — G8101 Flaccid hemiplegia affecting right dominant side: Secondary | ICD-10-CM | POA: Diagnosis not present

## 2017-03-10 DIAGNOSIS — G8101 Flaccid hemiplegia affecting right dominant side: Secondary | ICD-10-CM | POA: Diagnosis not present

## 2017-03-10 DIAGNOSIS — R1312 Dysphagia, oropharyngeal phase: Secondary | ICD-10-CM | POA: Diagnosis not present

## 2017-03-10 DIAGNOSIS — R293 Abnormal posture: Secondary | ICD-10-CM | POA: Diagnosis not present

## 2017-03-12 DIAGNOSIS — R1312 Dysphagia, oropharyngeal phase: Secondary | ICD-10-CM | POA: Diagnosis not present

## 2017-03-15 DIAGNOSIS — I7389 Other specified peripheral vascular diseases: Secondary | ICD-10-CM | POA: Diagnosis not present

## 2017-03-15 DIAGNOSIS — M6281 Muscle weakness (generalized): Secondary | ICD-10-CM | POA: Diagnosis not present

## 2017-03-15 DIAGNOSIS — B351 Tinea unguium: Secondary | ICD-10-CM | POA: Diagnosis not present

## 2017-03-15 DIAGNOSIS — M21372 Foot drop, left foot: Secondary | ICD-10-CM | POA: Diagnosis not present

## 2017-03-21 DIAGNOSIS — F419 Anxiety disorder, unspecified: Secondary | ICD-10-CM | POA: Diagnosis not present

## 2017-03-21 DIAGNOSIS — F339 Major depressive disorder, recurrent, unspecified: Secondary | ICD-10-CM | POA: Diagnosis not present

## 2017-03-21 DIAGNOSIS — F0150 Vascular dementia without behavioral disturbance: Secondary | ICD-10-CM | POA: Diagnosis not present

## 2017-03-21 DIAGNOSIS — G40909 Epilepsy, unspecified, not intractable, without status epilepticus: Secondary | ICD-10-CM | POA: Diagnosis not present

## 2017-03-22 DIAGNOSIS — H04123 Dry eye syndrome of bilateral lacrimal glands: Secondary | ICD-10-CM | POA: Diagnosis not present

## 2017-03-22 DIAGNOSIS — H2513 Age-related nuclear cataract, bilateral: Secondary | ICD-10-CM | POA: Diagnosis not present

## 2017-03-22 DIAGNOSIS — H47293 Other optic atrophy, bilateral: Secondary | ICD-10-CM | POA: Diagnosis not present

## 2017-03-23 DIAGNOSIS — G40909 Epilepsy, unspecified, not intractable, without status epilepticus: Secondary | ICD-10-CM | POA: Diagnosis not present

## 2017-03-23 DIAGNOSIS — F329 Major depressive disorder, single episode, unspecified: Secondary | ICD-10-CM | POA: Diagnosis not present

## 2017-06-06 DIAGNOSIS — F0150 Vascular dementia without behavioral disturbance: Secondary | ICD-10-CM | POA: Diagnosis not present

## 2017-06-06 DIAGNOSIS — F419 Anxiety disorder, unspecified: Secondary | ICD-10-CM | POA: Diagnosis not present

## 2017-06-06 DIAGNOSIS — F339 Major depressive disorder, recurrent, unspecified: Secondary | ICD-10-CM | POA: Diagnosis not present

## 2017-07-04 DIAGNOSIS — G8101 Flaccid hemiplegia affecting right dominant side: Secondary | ICD-10-CM | POA: Diagnosis not present

## 2017-07-04 DIAGNOSIS — M6281 Muscle weakness (generalized): Secondary | ICD-10-CM | POA: Diagnosis not present

## 2017-07-04 DIAGNOSIS — R1312 Dysphagia, oropharyngeal phase: Secondary | ICD-10-CM | POA: Diagnosis not present

## 2017-07-04 DIAGNOSIS — R1310 Dysphagia, unspecified: Secondary | ICD-10-CM | POA: Diagnosis not present

## 2017-07-05 DIAGNOSIS — M6281 Muscle weakness (generalized): Secondary | ICD-10-CM | POA: Diagnosis not present

## 2017-07-05 DIAGNOSIS — R1312 Dysphagia, oropharyngeal phase: Secondary | ICD-10-CM | POA: Diagnosis not present

## 2017-07-05 DIAGNOSIS — R1310 Dysphagia, unspecified: Secondary | ICD-10-CM | POA: Diagnosis not present

## 2017-07-05 DIAGNOSIS — G8101 Flaccid hemiplegia affecting right dominant side: Secondary | ICD-10-CM | POA: Diagnosis not present

## 2017-07-06 DIAGNOSIS — M6281 Muscle weakness (generalized): Secondary | ICD-10-CM | POA: Diagnosis not present

## 2017-07-06 DIAGNOSIS — G8101 Flaccid hemiplegia affecting right dominant side: Secondary | ICD-10-CM | POA: Diagnosis not present

## 2017-07-06 DIAGNOSIS — R1310 Dysphagia, unspecified: Secondary | ICD-10-CM | POA: Diagnosis not present

## 2017-07-06 DIAGNOSIS — R1312 Dysphagia, oropharyngeal phase: Secondary | ICD-10-CM | POA: Diagnosis not present

## 2017-07-09 DIAGNOSIS — M6281 Muscle weakness (generalized): Secondary | ICD-10-CM | POA: Diagnosis not present

## 2017-07-09 DIAGNOSIS — R1310 Dysphagia, unspecified: Secondary | ICD-10-CM | POA: Diagnosis not present

## 2017-07-09 DIAGNOSIS — R1312 Dysphagia, oropharyngeal phase: Secondary | ICD-10-CM | POA: Diagnosis not present

## 2017-07-09 DIAGNOSIS — G8101 Flaccid hemiplegia affecting right dominant side: Secondary | ICD-10-CM | POA: Diagnosis not present

## 2017-07-10 DIAGNOSIS — G8101 Flaccid hemiplegia affecting right dominant side: Secondary | ICD-10-CM | POA: Diagnosis not present

## 2017-07-10 DIAGNOSIS — R1312 Dysphagia, oropharyngeal phase: Secondary | ICD-10-CM | POA: Diagnosis not present

## 2017-07-10 DIAGNOSIS — Z23 Encounter for immunization: Secondary | ICD-10-CM | POA: Diagnosis not present

## 2017-07-10 DIAGNOSIS — M6281 Muscle weakness (generalized): Secondary | ICD-10-CM | POA: Diagnosis not present

## 2017-07-10 DIAGNOSIS — R1310 Dysphagia, unspecified: Secondary | ICD-10-CM | POA: Diagnosis not present

## 2017-07-11 DIAGNOSIS — M6281 Muscle weakness (generalized): Secondary | ICD-10-CM | POA: Diagnosis not present

## 2017-07-11 DIAGNOSIS — R1312 Dysphagia, oropharyngeal phase: Secondary | ICD-10-CM | POA: Diagnosis not present

## 2017-07-11 DIAGNOSIS — G8101 Flaccid hemiplegia affecting right dominant side: Secondary | ICD-10-CM | POA: Diagnosis not present

## 2017-07-11 DIAGNOSIS — R1310 Dysphagia, unspecified: Secondary | ICD-10-CM | POA: Diagnosis not present

## 2017-07-18 DIAGNOSIS — M6281 Muscle weakness (generalized): Secondary | ICD-10-CM | POA: Diagnosis not present

## 2017-07-18 DIAGNOSIS — R1312 Dysphagia, oropharyngeal phase: Secondary | ICD-10-CM | POA: Diagnosis not present

## 2017-07-18 DIAGNOSIS — M79674 Pain in right toe(s): Secondary | ICD-10-CM | POA: Diagnosis not present

## 2017-07-18 DIAGNOSIS — M79675 Pain in left toe(s): Secondary | ICD-10-CM | POA: Diagnosis not present

## 2017-07-18 DIAGNOSIS — B351 Tinea unguium: Secondary | ICD-10-CM | POA: Diagnosis not present

## 2017-07-18 DIAGNOSIS — G8101 Flaccid hemiplegia affecting right dominant side: Secondary | ICD-10-CM | POA: Diagnosis not present

## 2017-07-18 DIAGNOSIS — R1310 Dysphagia, unspecified: Secondary | ICD-10-CM | POA: Diagnosis not present

## 2017-07-19 DIAGNOSIS — G8101 Flaccid hemiplegia affecting right dominant side: Secondary | ICD-10-CM | POA: Diagnosis not present

## 2017-07-19 DIAGNOSIS — M6281 Muscle weakness (generalized): Secondary | ICD-10-CM | POA: Diagnosis not present

## 2017-07-19 DIAGNOSIS — R1310 Dysphagia, unspecified: Secondary | ICD-10-CM | POA: Diagnosis not present

## 2017-07-19 DIAGNOSIS — R1312 Dysphagia, oropharyngeal phase: Secondary | ICD-10-CM | POA: Diagnosis not present

## 2017-07-23 DIAGNOSIS — R1312 Dysphagia, oropharyngeal phase: Secondary | ICD-10-CM | POA: Diagnosis not present

## 2017-07-23 DIAGNOSIS — M6281 Muscle weakness (generalized): Secondary | ICD-10-CM | POA: Diagnosis not present

## 2017-07-23 DIAGNOSIS — R1310 Dysphagia, unspecified: Secondary | ICD-10-CM | POA: Diagnosis not present

## 2017-07-23 DIAGNOSIS — G8101 Flaccid hemiplegia affecting right dominant side: Secondary | ICD-10-CM | POA: Diagnosis not present

## 2017-07-25 DIAGNOSIS — R1310 Dysphagia, unspecified: Secondary | ICD-10-CM | POA: Diagnosis not present

## 2017-07-25 DIAGNOSIS — R1312 Dysphagia, oropharyngeal phase: Secondary | ICD-10-CM | POA: Diagnosis not present

## 2017-07-25 DIAGNOSIS — M6281 Muscle weakness (generalized): Secondary | ICD-10-CM | POA: Diagnosis not present

## 2017-07-25 DIAGNOSIS — G8101 Flaccid hemiplegia affecting right dominant side: Secondary | ICD-10-CM | POA: Diagnosis not present

## 2017-07-26 DIAGNOSIS — H04129 Dry eye syndrome of unspecified lacrimal gland: Secondary | ICD-10-CM | POA: Diagnosis not present

## 2017-07-26 DIAGNOSIS — R001 Bradycardia, unspecified: Secondary | ICD-10-CM | POA: Diagnosis not present

## 2017-07-26 DIAGNOSIS — G40909 Epilepsy, unspecified, not intractable, without status epilepticus: Secondary | ICD-10-CM | POA: Diagnosis not present

## 2017-07-26 DIAGNOSIS — F0150 Vascular dementia without behavioral disturbance: Secondary | ICD-10-CM | POA: Diagnosis not present

## 2017-07-27 DIAGNOSIS — G8101 Flaccid hemiplegia affecting right dominant side: Secondary | ICD-10-CM | POA: Diagnosis not present

## 2017-07-27 DIAGNOSIS — R1310 Dysphagia, unspecified: Secondary | ICD-10-CM | POA: Diagnosis not present

## 2017-07-27 DIAGNOSIS — M6281 Muscle weakness (generalized): Secondary | ICD-10-CM | POA: Diagnosis not present

## 2017-07-27 DIAGNOSIS — R1312 Dysphagia, oropharyngeal phase: Secondary | ICD-10-CM | POA: Diagnosis not present

## 2017-07-31 DIAGNOSIS — R1310 Dysphagia, unspecified: Secondary | ICD-10-CM | POA: Diagnosis not present

## 2017-07-31 DIAGNOSIS — G8101 Flaccid hemiplegia affecting right dominant side: Secondary | ICD-10-CM | POA: Diagnosis not present

## 2017-07-31 DIAGNOSIS — R1312 Dysphagia, oropharyngeal phase: Secondary | ICD-10-CM | POA: Diagnosis not present

## 2017-07-31 DIAGNOSIS — M6281 Muscle weakness (generalized): Secondary | ICD-10-CM | POA: Diagnosis not present

## 2017-08-03 DIAGNOSIS — R1312 Dysphagia, oropharyngeal phase: Secondary | ICD-10-CM | POA: Diagnosis not present

## 2017-08-03 DIAGNOSIS — R1310 Dysphagia, unspecified: Secondary | ICD-10-CM | POA: Diagnosis not present

## 2017-08-03 DIAGNOSIS — M6281 Muscle weakness (generalized): Secondary | ICD-10-CM | POA: Diagnosis not present

## 2017-08-03 DIAGNOSIS — G8101 Flaccid hemiplegia affecting right dominant side: Secondary | ICD-10-CM | POA: Diagnosis not present

## 2017-08-07 DIAGNOSIS — R1312 Dysphagia, oropharyngeal phase: Secondary | ICD-10-CM | POA: Diagnosis not present

## 2017-08-07 DIAGNOSIS — G8101 Flaccid hemiplegia affecting right dominant side: Secondary | ICD-10-CM | POA: Diagnosis not present

## 2017-08-07 DIAGNOSIS — M6281 Muscle weakness (generalized): Secondary | ICD-10-CM | POA: Diagnosis not present

## 2017-08-07 DIAGNOSIS — R1310 Dysphagia, unspecified: Secondary | ICD-10-CM | POA: Diagnosis not present

## 2017-08-09 DIAGNOSIS — M6281 Muscle weakness (generalized): Secondary | ICD-10-CM | POA: Diagnosis not present

## 2017-08-09 DIAGNOSIS — G8101 Flaccid hemiplegia affecting right dominant side: Secondary | ICD-10-CM | POA: Diagnosis not present

## 2017-08-09 DIAGNOSIS — R1312 Dysphagia, oropharyngeal phase: Secondary | ICD-10-CM | POA: Diagnosis not present

## 2017-08-09 DIAGNOSIS — R1310 Dysphagia, unspecified: Secondary | ICD-10-CM | POA: Diagnosis not present

## 2017-08-13 DIAGNOSIS — I10 Essential (primary) hypertension: Secondary | ICD-10-CM | POA: Diagnosis not present

## 2017-08-15 DIAGNOSIS — G8101 Flaccid hemiplegia affecting right dominant side: Secondary | ICD-10-CM | POA: Diagnosis not present

## 2017-08-15 DIAGNOSIS — R1312 Dysphagia, oropharyngeal phase: Secondary | ICD-10-CM | POA: Diagnosis not present

## 2017-08-15 DIAGNOSIS — R1310 Dysphagia, unspecified: Secondary | ICD-10-CM | POA: Diagnosis not present

## 2017-08-15 DIAGNOSIS — M6281 Muscle weakness (generalized): Secondary | ICD-10-CM | POA: Diagnosis not present

## 2017-08-29 DIAGNOSIS — L219 Seborrheic dermatitis, unspecified: Secondary | ICD-10-CM | POA: Diagnosis not present

## 2017-10-02 DIAGNOSIS — Z87820 Personal history of traumatic brain injury: Secondary | ICD-10-CM | POA: Diagnosis not present

## 2017-10-02 DIAGNOSIS — G8191 Hemiplegia, unspecified affecting right dominant side: Secondary | ICD-10-CM | POA: Diagnosis not present

## 2017-10-02 DIAGNOSIS — G40909 Epilepsy, unspecified, not intractable, without status epilepticus: Secondary | ICD-10-CM | POA: Diagnosis not present

## 2017-10-02 DIAGNOSIS — R4702 Dysphasia: Secondary | ICD-10-CM | POA: Diagnosis not present

## 2017-10-10 DIAGNOSIS — L0292 Furuncle, unspecified: Secondary | ICD-10-CM | POA: Diagnosis not present

## 2017-10-11 DIAGNOSIS — E46 Unspecified protein-calorie malnutrition: Secondary | ICD-10-CM | POA: Diagnosis not present

## 2017-10-11 DIAGNOSIS — R4701 Aphasia: Secondary | ICD-10-CM | POA: Diagnosis not present

## 2017-10-11 DIAGNOSIS — L02212 Cutaneous abscess of back [any part, except buttock]: Secondary | ICD-10-CM | POA: Diagnosis not present

## 2017-10-11 DIAGNOSIS — G8101 Flaccid hemiplegia affecting right dominant side: Secondary | ICD-10-CM | POA: Diagnosis not present

## 2017-10-11 DIAGNOSIS — B351 Tinea unguium: Secondary | ICD-10-CM | POA: Diagnosis not present

## 2017-10-11 DIAGNOSIS — I739 Peripheral vascular disease, unspecified: Secondary | ICD-10-CM | POA: Diagnosis not present

## 2017-10-11 DIAGNOSIS — R262 Difficulty in walking, not elsewhere classified: Secondary | ICD-10-CM | POA: Diagnosis not present

## 2017-10-12 DIAGNOSIS — Z471 Aftercare following joint replacement surgery: Secondary | ICD-10-CM | POA: Diagnosis not present

## 2017-10-16 DIAGNOSIS — L0291 Cutaneous abscess, unspecified: Secondary | ICD-10-CM | POA: Diagnosis not present

## 2017-10-17 DIAGNOSIS — L0291 Cutaneous abscess, unspecified: Secondary | ICD-10-CM | POA: Diagnosis not present

## 2017-10-18 DIAGNOSIS — R7881 Bacteremia: Secondary | ICD-10-CM | POA: Diagnosis not present

## 2017-10-22 DIAGNOSIS — I10 Essential (primary) hypertension: Secondary | ICD-10-CM | POA: Diagnosis not present

## 2017-10-22 DIAGNOSIS — A4902 Methicillin resistant Staphylococcus aureus infection, unspecified site: Secondary | ICD-10-CM | POA: Diagnosis not present

## 2017-10-23 DIAGNOSIS — A4902 Methicillin resistant Staphylococcus aureus infection, unspecified site: Secondary | ICD-10-CM | POA: Diagnosis not present

## 2017-10-23 DIAGNOSIS — L0291 Cutaneous abscess, unspecified: Secondary | ICD-10-CM | POA: Diagnosis not present

## 2017-10-25 DIAGNOSIS — L02212 Cutaneous abscess of back [any part, except buttock]: Secondary | ICD-10-CM | POA: Diagnosis not present

## 2017-10-25 DIAGNOSIS — G8101 Flaccid hemiplegia affecting right dominant side: Secondary | ICD-10-CM | POA: Diagnosis not present

## 2017-10-26 DIAGNOSIS — I10 Essential (primary) hypertension: Secondary | ICD-10-CM | POA: Diagnosis not present

## 2017-10-29 DIAGNOSIS — Z79899 Other long term (current) drug therapy: Secondary | ICD-10-CM | POA: Diagnosis not present

## 2017-10-31 DIAGNOSIS — I10 Essential (primary) hypertension: Secondary | ICD-10-CM | POA: Diagnosis not present

## 2017-11-02 DIAGNOSIS — L0291 Cutaneous abscess, unspecified: Secondary | ICD-10-CM | POA: Diagnosis not present

## 2017-11-08 DIAGNOSIS — G8101 Flaccid hemiplegia affecting right dominant side: Secondary | ICD-10-CM | POA: Diagnosis not present

## 2017-11-08 DIAGNOSIS — L02212 Cutaneous abscess of back [any part, except buttock]: Secondary | ICD-10-CM | POA: Diagnosis not present

## 2017-11-21 DIAGNOSIS — G8101 Flaccid hemiplegia affecting right dominant side: Secondary | ICD-10-CM | POA: Diagnosis not present

## 2017-11-21 DIAGNOSIS — R293 Abnormal posture: Secondary | ICD-10-CM | POA: Diagnosis not present

## 2017-11-21 DIAGNOSIS — M24521 Contracture, right elbow: Secondary | ICD-10-CM | POA: Diagnosis not present

## 2017-11-22 DIAGNOSIS — R293 Abnormal posture: Secondary | ICD-10-CM | POA: Diagnosis not present

## 2017-11-22 DIAGNOSIS — G8101 Flaccid hemiplegia affecting right dominant side: Secondary | ICD-10-CM | POA: Diagnosis not present

## 2017-11-22 DIAGNOSIS — M24521 Contracture, right elbow: Secondary | ICD-10-CM | POA: Diagnosis not present

## 2017-11-23 DIAGNOSIS — G8101 Flaccid hemiplegia affecting right dominant side: Secondary | ICD-10-CM | POA: Diagnosis not present

## 2017-11-23 DIAGNOSIS — L02212 Cutaneous abscess of back [any part, except buttock]: Secondary | ICD-10-CM | POA: Diagnosis not present

## 2017-11-23 DIAGNOSIS — L98422 Non-pressure chronic ulcer of back with fat layer exposed: Secondary | ICD-10-CM | POA: Diagnosis not present

## 2017-11-23 DIAGNOSIS — Z8673 Personal history of transient ischemic attack (TIA), and cerebral infarction without residual deficits: Secondary | ICD-10-CM | POA: Diagnosis not present

## 2017-11-26 DIAGNOSIS — M24521 Contracture, right elbow: Secondary | ICD-10-CM | POA: Diagnosis not present

## 2017-11-26 DIAGNOSIS — R293 Abnormal posture: Secondary | ICD-10-CM | POA: Diagnosis not present

## 2017-11-26 DIAGNOSIS — G8101 Flaccid hemiplegia affecting right dominant side: Secondary | ICD-10-CM | POA: Diagnosis not present

## 2017-11-28 DIAGNOSIS — R293 Abnormal posture: Secondary | ICD-10-CM | POA: Diagnosis not present

## 2017-11-28 DIAGNOSIS — G8101 Flaccid hemiplegia affecting right dominant side: Secondary | ICD-10-CM | POA: Diagnosis not present

## 2017-11-28 DIAGNOSIS — M24521 Contracture, right elbow: Secondary | ICD-10-CM | POA: Diagnosis not present

## 2017-11-29 DIAGNOSIS — F419 Anxiety disorder, unspecified: Secondary | ICD-10-CM | POA: Diagnosis not present

## 2017-11-29 DIAGNOSIS — G40909 Epilepsy, unspecified, not intractable, without status epilepticus: Secondary | ICD-10-CM | POA: Diagnosis not present

## 2017-11-29 DIAGNOSIS — F339 Major depressive disorder, recurrent, unspecified: Secondary | ICD-10-CM | POA: Diagnosis not present

## 2017-12-06 DIAGNOSIS — L02212 Cutaneous abscess of back [any part, except buttock]: Secondary | ICD-10-CM | POA: Diagnosis not present

## 2017-12-06 DIAGNOSIS — G8101 Flaccid hemiplegia affecting right dominant side: Secondary | ICD-10-CM | POA: Diagnosis not present

## 2017-12-06 DIAGNOSIS — L98422 Non-pressure chronic ulcer of back with fat layer exposed: Secondary | ICD-10-CM | POA: Diagnosis not present

## 2017-12-07 DIAGNOSIS — M24521 Contracture, right elbow: Secondary | ICD-10-CM | POA: Diagnosis not present

## 2017-12-07 DIAGNOSIS — G8101 Flaccid hemiplegia affecting right dominant side: Secondary | ICD-10-CM | POA: Diagnosis not present

## 2017-12-07 DIAGNOSIS — R293 Abnormal posture: Secondary | ICD-10-CM | POA: Diagnosis not present

## 2017-12-11 DIAGNOSIS — Z87820 Personal history of traumatic brain injury: Secondary | ICD-10-CM | POA: Diagnosis not present

## 2017-12-11 DIAGNOSIS — M24521 Contracture, right elbow: Secondary | ICD-10-CM | POA: Diagnosis not present

## 2017-12-11 DIAGNOSIS — G8101 Flaccid hemiplegia affecting right dominant side: Secondary | ICD-10-CM | POA: Diagnosis not present

## 2017-12-11 DIAGNOSIS — R293 Abnormal posture: Secondary | ICD-10-CM | POA: Diagnosis not present

## 2017-12-12 DIAGNOSIS — Z87820 Personal history of traumatic brain injury: Secondary | ICD-10-CM | POA: Diagnosis not present

## 2017-12-12 DIAGNOSIS — M24521 Contracture, right elbow: Secondary | ICD-10-CM | POA: Diagnosis not present

## 2017-12-12 DIAGNOSIS — G8101 Flaccid hemiplegia affecting right dominant side: Secondary | ICD-10-CM | POA: Diagnosis not present

## 2017-12-12 DIAGNOSIS — R293 Abnormal posture: Secondary | ICD-10-CM | POA: Diagnosis not present

## 2017-12-13 DIAGNOSIS — M24521 Contracture, right elbow: Secondary | ICD-10-CM | POA: Diagnosis not present

## 2017-12-13 DIAGNOSIS — G8101 Flaccid hemiplegia affecting right dominant side: Secondary | ICD-10-CM | POA: Diagnosis not present

## 2017-12-13 DIAGNOSIS — Z87820 Personal history of traumatic brain injury: Secondary | ICD-10-CM | POA: Diagnosis not present

## 2017-12-13 DIAGNOSIS — R293 Abnormal posture: Secondary | ICD-10-CM | POA: Diagnosis not present

## 2017-12-14 DIAGNOSIS — I739 Peripheral vascular disease, unspecified: Secondary | ICD-10-CM | POA: Diagnosis not present

## 2017-12-14 DIAGNOSIS — Z87820 Personal history of traumatic brain injury: Secondary | ICD-10-CM | POA: Diagnosis not present

## 2017-12-14 DIAGNOSIS — M24521 Contracture, right elbow: Secondary | ICD-10-CM | POA: Diagnosis not present

## 2017-12-14 DIAGNOSIS — R293 Abnormal posture: Secondary | ICD-10-CM | POA: Diagnosis not present

## 2017-12-14 DIAGNOSIS — B351 Tinea unguium: Secondary | ICD-10-CM | POA: Diagnosis not present

## 2017-12-14 DIAGNOSIS — G8101 Flaccid hemiplegia affecting right dominant side: Secondary | ICD-10-CM | POA: Diagnosis not present

## 2017-12-14 DIAGNOSIS — R262 Difficulty in walking, not elsewhere classified: Secondary | ICD-10-CM | POA: Diagnosis not present

## 2017-12-17 DIAGNOSIS — Z87820 Personal history of traumatic brain injury: Secondary | ICD-10-CM | POA: Diagnosis not present

## 2017-12-17 DIAGNOSIS — G8101 Flaccid hemiplegia affecting right dominant side: Secondary | ICD-10-CM | POA: Diagnosis not present

## 2017-12-17 DIAGNOSIS — M24521 Contracture, right elbow: Secondary | ICD-10-CM | POA: Diagnosis not present

## 2017-12-17 DIAGNOSIS — R293 Abnormal posture: Secondary | ICD-10-CM | POA: Diagnosis not present

## 2017-12-18 DIAGNOSIS — Z87820 Personal history of traumatic brain injury: Secondary | ICD-10-CM | POA: Diagnosis not present

## 2017-12-18 DIAGNOSIS — M24521 Contracture, right elbow: Secondary | ICD-10-CM | POA: Diagnosis not present

## 2017-12-18 DIAGNOSIS — G8101 Flaccid hemiplegia affecting right dominant side: Secondary | ICD-10-CM | POA: Diagnosis not present

## 2017-12-18 DIAGNOSIS — R293 Abnormal posture: Secondary | ICD-10-CM | POA: Diagnosis not present

## 2017-12-19 DIAGNOSIS — R062 Wheezing: Secondary | ICD-10-CM | POA: Diagnosis not present

## 2017-12-19 DIAGNOSIS — R0989 Other specified symptoms and signs involving the circulatory and respiratory systems: Secondary | ICD-10-CM | POA: Diagnosis not present

## 2017-12-19 DIAGNOSIS — G8101 Flaccid hemiplegia affecting right dominant side: Secondary | ICD-10-CM | POA: Diagnosis not present

## 2017-12-19 DIAGNOSIS — I959 Hypotension, unspecified: Secondary | ICD-10-CM | POA: Diagnosis not present

## 2017-12-19 DIAGNOSIS — R918 Other nonspecific abnormal finding of lung field: Secondary | ICD-10-CM | POA: Diagnosis not present

## 2017-12-19 DIAGNOSIS — M24521 Contracture, right elbow: Secondary | ICD-10-CM | POA: Diagnosis not present

## 2017-12-19 DIAGNOSIS — R293 Abnormal posture: Secondary | ICD-10-CM | POA: Diagnosis not present

## 2017-12-19 DIAGNOSIS — J22 Unspecified acute lower respiratory infection: Secondary | ICD-10-CM | POA: Diagnosis not present

## 2017-12-19 DIAGNOSIS — Z87820 Personal history of traumatic brain injury: Secondary | ICD-10-CM | POA: Diagnosis not present

## 2017-12-20 DIAGNOSIS — G8101 Flaccid hemiplegia affecting right dominant side: Secondary | ICD-10-CM | POA: Diagnosis not present

## 2017-12-20 DIAGNOSIS — M24521 Contracture, right elbow: Secondary | ICD-10-CM | POA: Diagnosis not present

## 2017-12-20 DIAGNOSIS — R293 Abnormal posture: Secondary | ICD-10-CM | POA: Diagnosis not present

## 2017-12-20 DIAGNOSIS — R062 Wheezing: Secondary | ICD-10-CM | POA: Diagnosis not present

## 2017-12-20 DIAGNOSIS — Z87820 Personal history of traumatic brain injury: Secondary | ICD-10-CM | POA: Diagnosis not present

## 2017-12-21 DIAGNOSIS — G8101 Flaccid hemiplegia affecting right dominant side: Secondary | ICD-10-CM | POA: Diagnosis not present

## 2017-12-21 DIAGNOSIS — R293 Abnormal posture: Secondary | ICD-10-CM | POA: Diagnosis not present

## 2017-12-21 DIAGNOSIS — M24521 Contracture, right elbow: Secondary | ICD-10-CM | POA: Diagnosis not present

## 2017-12-21 DIAGNOSIS — Z87820 Personal history of traumatic brain injury: Secondary | ICD-10-CM | POA: Diagnosis not present

## 2017-12-21 DIAGNOSIS — J22 Unspecified acute lower respiratory infection: Secondary | ICD-10-CM | POA: Diagnosis not present

## 2017-12-24 DIAGNOSIS — M24521 Contracture, right elbow: Secondary | ICD-10-CM | POA: Diagnosis not present

## 2017-12-24 DIAGNOSIS — G8101 Flaccid hemiplegia affecting right dominant side: Secondary | ICD-10-CM | POA: Diagnosis not present

## 2017-12-24 DIAGNOSIS — R293 Abnormal posture: Secondary | ICD-10-CM | POA: Diagnosis not present

## 2017-12-24 DIAGNOSIS — Z87820 Personal history of traumatic brain injury: Secondary | ICD-10-CM | POA: Diagnosis not present

## 2017-12-25 DIAGNOSIS — R293 Abnormal posture: Secondary | ICD-10-CM | POA: Diagnosis not present

## 2017-12-25 DIAGNOSIS — Z87820 Personal history of traumatic brain injury: Secondary | ICD-10-CM | POA: Diagnosis not present

## 2017-12-25 DIAGNOSIS — M24521 Contracture, right elbow: Secondary | ICD-10-CM | POA: Diagnosis not present

## 2017-12-25 DIAGNOSIS — G8101 Flaccid hemiplegia affecting right dominant side: Secondary | ICD-10-CM | POA: Diagnosis not present

## 2017-12-26 DIAGNOSIS — F0150 Vascular dementia without behavioral disturbance: Secondary | ICD-10-CM | POA: Diagnosis not present

## 2017-12-26 DIAGNOSIS — M24521 Contracture, right elbow: Secondary | ICD-10-CM | POA: Diagnosis not present

## 2017-12-26 DIAGNOSIS — F419 Anxiety disorder, unspecified: Secondary | ICD-10-CM | POA: Diagnosis not present

## 2017-12-26 DIAGNOSIS — G8101 Flaccid hemiplegia affecting right dominant side: Secondary | ICD-10-CM | POA: Diagnosis not present

## 2017-12-26 DIAGNOSIS — Z87820 Personal history of traumatic brain injury: Secondary | ICD-10-CM | POA: Diagnosis not present

## 2017-12-26 DIAGNOSIS — R293 Abnormal posture: Secondary | ICD-10-CM | POA: Diagnosis not present

## 2017-12-26 DIAGNOSIS — F339 Major depressive disorder, recurrent, unspecified: Secondary | ICD-10-CM | POA: Diagnosis not present

## 2017-12-27 DIAGNOSIS — Z87820 Personal history of traumatic brain injury: Secondary | ICD-10-CM | POA: Diagnosis not present

## 2017-12-27 DIAGNOSIS — M24521 Contracture, right elbow: Secondary | ICD-10-CM | POA: Diagnosis not present

## 2017-12-27 DIAGNOSIS — G8101 Flaccid hemiplegia affecting right dominant side: Secondary | ICD-10-CM | POA: Diagnosis not present

## 2017-12-27 DIAGNOSIS — R293 Abnormal posture: Secondary | ICD-10-CM | POA: Diagnosis not present

## 2017-12-28 DIAGNOSIS — M24521 Contracture, right elbow: Secondary | ICD-10-CM | POA: Diagnosis not present

## 2017-12-28 DIAGNOSIS — Z87820 Personal history of traumatic brain injury: Secondary | ICD-10-CM | POA: Diagnosis not present

## 2017-12-28 DIAGNOSIS — G8101 Flaccid hemiplegia affecting right dominant side: Secondary | ICD-10-CM | POA: Diagnosis not present

## 2017-12-28 DIAGNOSIS — R293 Abnormal posture: Secondary | ICD-10-CM | POA: Diagnosis not present

## 2017-12-31 DIAGNOSIS — Z87820 Personal history of traumatic brain injury: Secondary | ICD-10-CM | POA: Diagnosis not present

## 2017-12-31 DIAGNOSIS — G8101 Flaccid hemiplegia affecting right dominant side: Secondary | ICD-10-CM | POA: Diagnosis not present

## 2017-12-31 DIAGNOSIS — R293 Abnormal posture: Secondary | ICD-10-CM | POA: Diagnosis not present

## 2017-12-31 DIAGNOSIS — M24521 Contracture, right elbow: Secondary | ICD-10-CM | POA: Diagnosis not present

## 2018-01-01 DIAGNOSIS — Z87820 Personal history of traumatic brain injury: Secondary | ICD-10-CM | POA: Diagnosis not present

## 2018-01-01 DIAGNOSIS — G8101 Flaccid hemiplegia affecting right dominant side: Secondary | ICD-10-CM | POA: Diagnosis not present

## 2018-01-01 DIAGNOSIS — R293 Abnormal posture: Secondary | ICD-10-CM | POA: Diagnosis not present

## 2018-01-01 DIAGNOSIS — M24521 Contracture, right elbow: Secondary | ICD-10-CM | POA: Diagnosis not present

## 2018-01-02 DIAGNOSIS — G8101 Flaccid hemiplegia affecting right dominant side: Secondary | ICD-10-CM | POA: Diagnosis not present

## 2018-01-02 DIAGNOSIS — R293 Abnormal posture: Secondary | ICD-10-CM | POA: Diagnosis not present

## 2018-01-02 DIAGNOSIS — M24521 Contracture, right elbow: Secondary | ICD-10-CM | POA: Diagnosis not present

## 2018-01-02 DIAGNOSIS — Z87820 Personal history of traumatic brain injury: Secondary | ICD-10-CM | POA: Diagnosis not present

## 2018-01-03 DIAGNOSIS — R293 Abnormal posture: Secondary | ICD-10-CM | POA: Diagnosis not present

## 2018-01-03 DIAGNOSIS — Z87820 Personal history of traumatic brain injury: Secondary | ICD-10-CM | POA: Diagnosis not present

## 2018-01-03 DIAGNOSIS — G8101 Flaccid hemiplegia affecting right dominant side: Secondary | ICD-10-CM | POA: Diagnosis not present

## 2018-01-03 DIAGNOSIS — M24521 Contracture, right elbow: Secondary | ICD-10-CM | POA: Diagnosis not present

## 2018-01-04 DIAGNOSIS — R293 Abnormal posture: Secondary | ICD-10-CM | POA: Diagnosis not present

## 2018-01-04 DIAGNOSIS — G8101 Flaccid hemiplegia affecting right dominant side: Secondary | ICD-10-CM | POA: Diagnosis not present

## 2018-01-04 DIAGNOSIS — Z87820 Personal history of traumatic brain injury: Secondary | ICD-10-CM | POA: Diagnosis not present

## 2018-01-04 DIAGNOSIS — M24521 Contracture, right elbow: Secondary | ICD-10-CM | POA: Diagnosis not present

## 2018-01-11 DIAGNOSIS — L0291 Cutaneous abscess, unspecified: Secondary | ICD-10-CM | POA: Diagnosis not present

## 2018-02-06 DIAGNOSIS — L0291 Cutaneous abscess, unspecified: Secondary | ICD-10-CM | POA: Diagnosis not present

## 2018-02-06 DIAGNOSIS — F0150 Vascular dementia without behavioral disturbance: Secondary | ICD-10-CM | POA: Diagnosis not present

## 2018-02-06 DIAGNOSIS — G8191 Hemiplegia, unspecified affecting right dominant side: Secondary | ICD-10-CM | POA: Diagnosis not present

## 2018-04-24 ENCOUNTER — Encounter: Payer: Self-pay | Admitting: Internal Medicine

## 2018-11-29 ENCOUNTER — Emergency Department (HOSPITAL_COMMUNITY): Payer: Medicare Other

## 2018-11-29 ENCOUNTER — Encounter (HOSPITAL_COMMUNITY): Payer: Self-pay | Admitting: *Deleted

## 2018-11-29 ENCOUNTER — Other Ambulatory Visit: Payer: Self-pay

## 2018-11-29 ENCOUNTER — Inpatient Hospital Stay (HOSPITAL_COMMUNITY)
Admission: EM | Admit: 2018-11-29 | Discharge: 2018-12-05 | DRG: 194 | Disposition: A | Discharge status: Hospice | Payer: Medicare Other | Attending: Internal Medicine | Admitting: Internal Medicine

## 2018-11-29 DIAGNOSIS — Z515 Encounter for palliative care: Secondary | ICD-10-CM | POA: Diagnosis not present

## 2018-11-29 DIAGNOSIS — G8191 Hemiplegia, unspecified affecting right dominant side: Secondary | ICD-10-CM | POA: Diagnosis present

## 2018-11-29 DIAGNOSIS — T402X5A Adverse effect of other opioids, initial encounter: Secondary | ICD-10-CM | POA: Diagnosis not present

## 2018-11-29 DIAGNOSIS — K56609 Unspecified intestinal obstruction, unspecified as to partial versus complete obstruction: Secondary | ICD-10-CM

## 2018-11-29 DIAGNOSIS — Z7401 Bed confinement status: Secondary | ICD-10-CM

## 2018-11-29 DIAGNOSIS — J302 Other seasonal allergic rhinitis: Secondary | ICD-10-CM | POA: Diagnosis present

## 2018-11-29 DIAGNOSIS — K8020 Calculus of gallbladder without cholecystitis without obstruction: Secondary | ICD-10-CM | POA: Diagnosis present

## 2018-11-29 DIAGNOSIS — K5641 Fecal impaction: Secondary | ICD-10-CM | POA: Diagnosis present

## 2018-11-29 DIAGNOSIS — S3730XA Unspecified injury of urethra, initial encounter: Secondary | ICD-10-CM | POA: Diagnosis present

## 2018-11-29 DIAGNOSIS — D539 Nutritional anemia, unspecified: Secondary | ICD-10-CM | POA: Diagnosis present

## 2018-11-29 DIAGNOSIS — Z79899 Other long term (current) drug therapy: Secondary | ICD-10-CM

## 2018-11-29 DIAGNOSIS — N281 Cyst of kidney, acquired: Secondary | ICD-10-CM | POA: Diagnosis present

## 2018-11-29 DIAGNOSIS — D696 Thrombocytopenia, unspecified: Secondary | ICD-10-CM | POA: Diagnosis present

## 2018-11-29 DIAGNOSIS — Z87820 Personal history of traumatic brain injury: Secondary | ICD-10-CM

## 2018-11-29 DIAGNOSIS — N390 Urinary tract infection, site not specified: Secondary | ICD-10-CM | POA: Diagnosis present

## 2018-11-29 DIAGNOSIS — J189 Pneumonia, unspecified organism: Principal | ICD-10-CM | POA: Diagnosis present

## 2018-11-29 DIAGNOSIS — Z66 Do not resuscitate: Secondary | ICD-10-CM | POA: Diagnosis present

## 2018-11-29 DIAGNOSIS — Z905 Acquired absence of kidney: Secondary | ICD-10-CM

## 2018-11-29 DIAGNOSIS — A419 Sepsis, unspecified organism: Principal | ICD-10-CM | POA: Diagnosis present

## 2018-11-29 DIAGNOSIS — Z681 Body mass index (BMI) 19 or less, adult: Secondary | ICD-10-CM | POA: Diagnosis not present

## 2018-11-29 DIAGNOSIS — R319 Hematuria, unspecified: Secondary | ICD-10-CM | POA: Diagnosis not present

## 2018-11-29 DIAGNOSIS — E440 Moderate protein-calorie malnutrition: Secondary | ICD-10-CM | POA: Diagnosis present

## 2018-11-29 DIAGNOSIS — R1310 Dysphagia, unspecified: Secondary | ICD-10-CM | POA: Diagnosis present

## 2018-11-29 DIAGNOSIS — Z7189 Other specified counseling: Secondary | ICD-10-CM | POA: Diagnosis not present

## 2018-11-29 DIAGNOSIS — I959 Hypotension, unspecified: Secondary | ICD-10-CM

## 2018-11-29 DIAGNOSIS — E860 Dehydration: Secondary | ICD-10-CM | POA: Diagnosis present

## 2018-11-29 DIAGNOSIS — G40909 Epilepsy, unspecified, not intractable, without status epilepticus: Secondary | ICD-10-CM | POA: Diagnosis present

## 2018-11-29 DIAGNOSIS — R001 Bradycardia, unspecified: Secondary | ICD-10-CM | POA: Diagnosis present

## 2018-11-29 DIAGNOSIS — H04129 Dry eye syndrome of unspecified lacrimal gland: Secondary | ICD-10-CM | POA: Diagnosis present

## 2018-11-29 DIAGNOSIS — R310 Gross hematuria: Secondary | ICD-10-CM | POA: Diagnosis present

## 2018-11-29 DIAGNOSIS — K5900 Constipation, unspecified: Secondary | ICD-10-CM

## 2018-11-29 DIAGNOSIS — F329 Major depressive disorder, single episode, unspecified: Secondary | ICD-10-CM | POA: Diagnosis present

## 2018-11-29 DIAGNOSIS — Y95 Nosocomial condition: Secondary | ICD-10-CM | POA: Diagnosis present

## 2018-11-29 DIAGNOSIS — E785 Hyperlipidemia, unspecified: Secondary | ICD-10-CM | POA: Diagnosis present

## 2018-11-29 DIAGNOSIS — F419 Anxiety disorder, unspecified: Secondary | ICD-10-CM | POA: Diagnosis present

## 2018-11-29 LAB — COMPREHENSIVE METABOLIC PANEL
ALT: 15 U/L (ref 0–44)
AST: 19 U/L (ref 15–41)
Albumin: 2.6 g/dL — ABNORMAL LOW (ref 3.5–5.0)
Alkaline Phosphatase: 75 U/L (ref 38–126)
Anion gap: 6 (ref 5–15)
BUN: 20 mg/dL (ref 8–23)
CALCIUM: 7.2 mg/dL — AB (ref 8.9–10.3)
CO2: 28 mmol/L (ref 22–32)
Chloride: 106 mmol/L (ref 98–111)
Creatinine, Ser: 0.76 mg/dL (ref 0.61–1.24)
GFR calc Af Amer: >60 mL/min (ref >60)
GFR calc non Af Amer: >60 mL/min (ref >60)
Glucose, Bld: 130 mg/dL — ABNORMAL HIGH (ref 70–99)
Potassium: 3.5 mmol/L (ref 3.5–5.1)
Sodium: 140 mmol/L (ref 135–145)
Total Bilirubin: 0.2 mg/dL — ABNORMAL LOW (ref 0.3–1.2)
Total Protein: 4.9 g/dL — ABNORMAL LOW (ref 6.5–8.1)

## 2018-11-29 LAB — URINALYSIS, ROUTINE W REFLEX MICROSCOPIC
Bilirubin Urine: NEGATIVE
Glucose, UA: NEGATIVE mg/dL
Ketones, ur: NEGATIVE mg/dL
NITRITE: NEGATIVE
Protein, ur: 30 mg/dL — AB
RBC / HPF: >50 RBC/hpf — ABNORMAL HIGH (ref 0–5)
SPECIFIC GRAVITY, URINE: 1.014 (ref 1.005–1.030)
WBC, UA: >50 WBC/hpf — ABNORMAL HIGH (ref 0–5)
pH: 5 (ref 5.0–8.0)

## 2018-11-29 LAB — CBC WITH DIFFERENTIAL/PLATELET
Abs Immature Granulocytes: 0.02 10*3/uL (ref 0.00–0.07)
Basophils Absolute: 0 10*3/uL (ref 0.0–0.1)
Basophils Relative: 0 %
EOS ABS: 0 10*3/uL (ref 0.0–0.5)
Eosinophils Relative: 1 %
HCT: 32.2 % — ABNORMAL LOW (ref 39.0–52.0)
Hemoglobin: 10.1 g/dL — ABNORMAL LOW (ref 13.0–17.0)
IMMATURE GRANULOCYTES: 0 %
Lymphocytes Relative: 10 %
Lymphs Abs: 0.6 10*3/uL — ABNORMAL LOW (ref 0.7–4.0)
MCH: 32.6 pg (ref 26.0–34.0)
MCHC: 31.4 g/dL (ref 30.0–36.0)
MCV: 103.9 fL — AB (ref 80.0–100.0)
Monocytes Absolute: 0.6 10*3/uL (ref 0.1–1.0)
Monocytes Relative: 9 %
Neutro Abs: 5.1 10*3/uL (ref 1.7–7.7)
Neutrophils Relative %: 80 %
PLATELETS: 147 10*3/uL — AB (ref 150–400)
RBC: 3.1 MIL/uL — ABNORMAL LOW (ref 4.22–5.81)
RDW: 12.5 % (ref 11.5–15.5)
WBC: 6.4 10*3/uL (ref 4.0–10.5)
nRBC: 0 % (ref 0.0–0.2)

## 2018-11-29 LAB — PROTIME-INR
INR: 1.2 (ref 0.8–1.2)
Prothrombin Time: 14.6 seconds (ref 11.4–15.2)

## 2018-11-29 LAB — LACTIC ACID, PLASMA
Lactic Acid, Venous: 1.3 mmol/L (ref 0.5–1.9)
Lactic Acid, Venous: 1 mmol/L (ref 0.5–1.9)

## 2018-11-29 LAB — LIPASE, BLOOD: Lipase: 29 U/L (ref 11–51)

## 2018-11-29 MED ORDER — PROSIGHT PO TABS
1.0000 | ORAL_TABLET | Freq: Two times a day (BID) | ORAL | Status: DC
  Administered 2018-11-30 – 2018-12-02 (×7): 1 via ORAL
  Filled 2018-11-29 (×8): qty 1

## 2018-11-29 MED ORDER — ARTIFICIAL TEARS OPHTHALMIC OINT
1.0000 "application " | TOPICAL_OINTMENT | Freq: Every day | OPHTHALMIC | Status: DC
  Administered 2018-11-30 – 2018-12-04 (×6): 1 via OPHTHALMIC
  Filled 2018-11-29: qty 3.5

## 2018-11-29 MED ORDER — ACETAMINOPHEN 325 MG PO TABS
650.0000 mg | ORAL_TABLET | Freq: Four times a day (QID) | ORAL | Status: DC | PRN
  Administered 2018-12-02: 650 mg via ORAL
  Filled 2018-11-29: qty 2

## 2018-11-29 MED ORDER — POLYETHYLENE GLYCOL 3350 17 G PO PACK: 17.0000 g | PACK | Freq: Two times a day (BID) | ORAL | Status: DC

## 2018-11-29 MED ORDER — METRONIDAZOLE IN NACL 5-0.79 MG/ML-% IV SOLN
500.0000 mg | Freq: Once | INTRAVENOUS | Status: AC
  Administered 2018-11-29: 500 mg via INTRAVENOUS
  Filled 2018-11-29: qty 100

## 2018-11-29 MED ORDER — SODIUM CHLORIDE 0.9 % IV SOLN
2.0000 g | Freq: Three times a day (TID) | INTRAVENOUS | Status: DC
  Administered 2018-11-30 (×2): 2 g via INTRAVENOUS
  Filled 2018-11-29 (×3): qty 2

## 2018-11-29 MED ORDER — CARBAMAZEPINE ER 100 MG PO TB12
300.0000 mg | ORAL_TABLET | Freq: Three times a day (TID) | ORAL | Status: DC
  Administered 2018-11-30 – 2018-12-02 (×9): 300 mg via ORAL
  Filled 2018-11-29 (×11): qty 3

## 2018-11-29 MED ORDER — SODIUM CHLORIDE 0.9 % IV SOLN
2.0000 g | Freq: Once | INTRAVENOUS | Status: AC
  Administered 2018-11-29: 2 g via INTRAVENOUS
  Filled 2018-11-29: qty 2

## 2018-11-29 MED ORDER — SODIUM CHLORIDE 0.9 % IV BOLUS
1000.0000 mL | Freq: Once | INTRAVENOUS | Status: AC
  Administered 2018-11-29: 1000 mL via INTRAVENOUS

## 2018-11-29 MED ORDER — IPRATROPIUM-ALBUTEROL 0.5-2.5 (3) MG/3ML IN SOLN
RESPIRATORY_TRACT | Status: AC
  Filled 2018-11-29: qty 3

## 2018-11-29 MED ORDER — VITAMIN B-12 1000 MCG PO TABS
1000.0000 ug | ORAL_TABLET | Freq: Every day | ORAL | Status: DC
  Administered 2018-11-30 – 2018-12-02 (×3): 1000 ug via ORAL
  Filled 2018-11-29 (×3): qty 1

## 2018-11-29 MED ORDER — METRONIDAZOLE IN NACL 5-0.79 MG/ML-% IV SOLN
500.0000 mg | Freq: Three times a day (TID) | INTRAVENOUS | Status: DC
  Administered 2018-11-30 – 2018-12-01 (×5): 500 mg via INTRAVENOUS
  Filled 2018-11-29 (×5): qty 100

## 2018-11-29 MED ORDER — SENNOSIDES-DOCUSATE SODIUM 8.6-50 MG PO TABS
1.0000 | ORAL_TABLET | Freq: Two times a day (BID) | ORAL | Status: DC
  Administered 2018-11-30: 1 via ORAL
  Filled 2018-11-29: qty 1

## 2018-11-29 MED ORDER — CITALOPRAM HYDROBROMIDE 10 MG PO TABS
10.0000 mg | ORAL_TABLET | Freq: Every day | ORAL | Status: DC
  Administered 2018-11-30 – 2018-12-02 (×3): 10 mg via ORAL
  Filled 2018-11-29 (×4): qty 1

## 2018-11-29 MED ORDER — PHENOBARBITAL 32.4 MG PO TABS
97.2000 mg | ORAL_TABLET | Freq: Every day | ORAL | Status: DC
  Administered 2018-11-30 – 2018-12-02 (×4): 97.2 mg via ORAL
  Filled 2018-11-29 (×4): qty 3

## 2018-11-29 MED ORDER — IPRATROPIUM-ALBUTEROL 0.5-2.5 (3) MG/3ML IN SOLN
3.0000 mL | Freq: Four times a day (QID) | RESPIRATORY_TRACT | Status: DC
  Administered 2018-11-29 – 2018-11-30 (×2): 3 mL via RESPIRATORY_TRACT
  Filled 2018-11-29: qty 3

## 2018-11-29 MED ORDER — LORAZEPAM 0.5 MG PO TABS
0.2500 mg | ORAL_TABLET | Freq: Every day | ORAL | Status: DC
  Administered 2018-11-30 – 2018-12-02 (×3): 0.25 mg via ORAL
  Filled 2018-11-29 (×3): qty 1

## 2018-11-29 MED ORDER — SODIUM CHLORIDE 0.9 % IV SOLN
INTRAVENOUS | Status: AC
  Administered 2018-11-29 – 2018-11-30 (×2): via INTRAVENOUS

## 2018-11-29 MED ORDER — SODIUM CHLORIDE 0.9 % IV BOLUS
500.0000 mL | Freq: Once | INTRAVENOUS | Status: AC
  Administered 2018-11-29: 500 mL via INTRAVENOUS

## 2018-11-29 MED ORDER — BISACODYL 10 MG RE SUPP: 10.0000 mg | RECTAL | Status: DC

## 2018-11-29 MED ORDER — VANCOMYCIN HCL IN DEXTROSE 1-5 GM/200ML-% IV SOLN
1000.0000 mg | Freq: Two times a day (BID) | INTRAVENOUS | Status: DC
  Administered 2018-11-30: 1000 mg via INTRAVENOUS
  Filled 2018-11-29: qty 200

## 2018-11-29 MED ORDER — VANCOMYCIN HCL IN DEXTROSE 1-5 GM/200ML-% IV SOLN
1000.0000 mg | Freq: Once | INTRAVENOUS | Status: AC
  Administered 2018-11-29: 1000 mg via INTRAVENOUS
  Filled 2018-11-29: qty 200

## 2018-11-29 NOTE — ED Notes (Signed)
Condom Catheter in place with drainage bag. Patient nods his head yes that he is comfortable.
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
## 2018-11-29 NOTE — Progress Notes (Signed)
Pharmacy Antibiotic Note  Joshua Munoz is a 68 y.o. male admitted on 11/29/2018 with hematuria.  Pharmacy has been consulted for vancomycin and cefepime dosing for sepsis.  Per EDP notes, started on ceftriaxone and tamiflu yesterday.  Afebrile WBC 6.4 SCr 0.76, CrCl > 60 ml/min Lactate 1.3 > 1.0  Estimated height: 75 inches Estimated weight: 70kg  Plan: Vancomycin 1g IV given at 1643, continue with 1g IV q12h - begin in 8hr since no loading dose given.  Estimated AUC 508, goal 400-550 Cefepime 2g IV q8h Follow up renal function & cultures    Temp (24hrs), Avg:98 F (36.7 C), Min:97.4 F (36.3 C), Max:98.6 F (37 C)  Recent Labs  Lab 11/29/18 1355 11/29/18 1814  WBC 6.4  --   CREATININE 0.76  --   LATICACIDVEN 1.3 1.0    CrCl cannot be calculated (Unknown ideal weight.).    No Known Allergies  Antimicrobials this admission:  3/20 Flagyl x 1 3/20 Vancomycin >> 3/20 Cefepime >>  Dose adjustments this admission:   Microbiology results:  3/20 BCx: sent 3/20 UCx: sent 3/20 HIV Ab: 3/20 MRSA PCR:   Thank you for allowing pharmacy to be a part of this patient's care.  Loralee Pacas, PharmD, BCPS Pager: [phone] 11/29/2018 8:04 PM

## 2018-11-29 NOTE — ED Notes (Signed)
Attempted to manually disimpact pt. However stool is not down near the pt's anus nor low in the pt's rectum. It was very soft when felt. MD notified.

## 2018-11-29 NOTE — ED Notes (Addendum)
Patient transported to CT 

## 2018-11-29 NOTE — ED Triage Notes (Signed)
Pt BIB EMS and coming from [facility].  Staff reports attempting to place foley catheter last night and this morning for urinary retention and were unsuccessful both time. Since the first attempt since last night, pt has been bleeding.  Nursing home staff has changed the brief hourly.  EMS states that a new brief was placed on pt prior to transport and the brief is now saturated upon arrival to ED.  Pt at baseline is nonverbal, right posturing with a left gaze.  Pt is oriented to baseline with a MOST form at bedside.  Pt will nod "yes or no." Nursing staff gave pt 2L of saline last night and EMS gave pt 1L of saline enroute.  Pt has been treated with tamiflu for a temperature but staff was unable to give EMS a recent temperature today and EMS is unable to produce one with their thermometer.   EMS: BP: 93/46 (initial 78/30), HR: 60NSR, 99%3L (on 3L since last week) , RR: 16, CBG:185

## 2018-11-29 NOTE — Progress Notes (Signed)
A consult was received from an ED physician for Cefepime + Vancomycin per pharmacy dosing.  The patient's profile has been reviewed for ht/wt/allergies/indication/available labs.   A one time order has been placed for Cefepime 2gm & Vancomycin 1gm IV.  Further antibiotics/pharmacy consults should be ordered by admitting physician if indicated.                       Thank you, Elson Clan 11/29/2018  2:00 PM
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
## 2018-11-29 NOTE — ED Notes (Signed)
PA made away patient urinated into condom cath. Per [place], [place] do not insert foley and PA will discontinue.

## 2018-11-29 NOTE — ED Notes (Signed)
Soap sud enema completed at this time. Pt tolerated well.

## 2018-11-29 NOTE — ED Notes (Signed)
Patients brief changed and patient cleaned with soap and wash clothes. Patient had several blood clots in brief. PA made aware.

## 2018-11-29 NOTE — ED Notes (Signed)
Pt given half of soap suds enema. Pt tolerated well however was being to moan with discomfort. Pt placed on bedpan and will attempt to finish enema soon.

## 2018-11-29 NOTE — ED Notes (Addendum)
Spoke to NP [phone] Merril Abbe, NP) @ [facility]. NP states family does not want patient hopsitilized.

## 2018-11-29 NOTE — ED Notes (Signed)
Coming from [facility]-NP states he had clots in his foley, attempted to irrigate with little success-states she removed foley and now patient having blood draining from penis-states he is being treated for PNA  and was placed on Tamaflu yesterday due to a fever of 102.1

## 2018-11-29 NOTE — ED Notes (Signed)
Bed: [facility] Expected date:  Expected time:  Means of arrival:  Comments: EMS-hematuria-room 9

## 2018-11-30 ENCOUNTER — Inpatient Hospital Stay (HOSPITAL_COMMUNITY): Payer: Medicare Other

## 2018-11-30 DIAGNOSIS — R319 Hematuria, unspecified: Secondary | ICD-10-CM

## 2018-11-30 DIAGNOSIS — J189 Pneumonia, unspecified organism: Secondary | ICD-10-CM

## 2018-11-30 DIAGNOSIS — K56609 Unspecified intestinal obstruction, unspecified as to partial versus complete obstruction: Secondary | ICD-10-CM

## 2018-11-30 DIAGNOSIS — K5641 Fecal impaction: Secondary | ICD-10-CM

## 2018-11-30 DIAGNOSIS — N390 Urinary tract infection, site not specified: Secondary | ICD-10-CM

## 2018-11-30 DIAGNOSIS — S3730XA Unspecified injury of urethra, initial encounter: Secondary | ICD-10-CM

## 2018-11-30 LAB — CBC
HCT: 30 % — ABNORMAL LOW (ref 39.0–52.0)
Hemoglobin: 9 g/dL — ABNORMAL LOW (ref 13.0–17.0)
MCH: 32.1 pg (ref 26.0–34.0)
MCHC: 30 g/dL (ref 30.0–36.0)
MCV: 107.1 fL — ABNORMAL HIGH (ref 80.0–100.0)
Platelets: 134 10*3/uL — ABNORMAL LOW (ref 150–400)
RBC: 2.8 MIL/uL — AB (ref 4.22–5.81)
RDW: 12.2 % (ref 11.5–15.5)
WBC: 4.1 10*3/uL (ref 4.0–10.5)
nRBC: 0 % (ref 0.0–0.2)

## 2018-11-30 LAB — RETICULOCYTES
IMMATURE RETIC FRACT: 8.7 % (ref 2.3–15.9)
RBC.: 2.75 MIL/uL — ABNORMAL LOW (ref 4.22–5.81)
RETIC CT PCT: 1.2 % (ref 0.4–3.1)
Retic Count, Absolute: 33.8 10*3/uL (ref 19.0–186.0)

## 2018-11-30 LAB — IRON AND TIBC
Iron: 38 ug/dL — ABNORMAL LOW (ref 45–182)
Saturation Ratios: 19 % (ref 17.9–39.5)
TIBC: 201 ug/dL — ABNORMAL LOW (ref 250–450)
UIBC: 163 ug/dL

## 2018-11-30 LAB — BASIC METABOLIC PANEL
Anion gap: 4 — ABNORMAL LOW (ref 5–15)
BUN: 16 mg/dL (ref 8–23)
CO2: 26 mmol/L (ref 22–32)
Calcium: 7.4 mg/dL — ABNORMAL LOW (ref 8.9–10.3)
Chloride: 109 mmol/L (ref 98–111)
Creatinine, Ser: 0.46 mg/dL — ABNORMAL LOW (ref 0.61–1.24)
GFR calc Af Amer: >60 mL/min (ref >60)
Glucose, Bld: 105 mg/dL — ABNORMAL HIGH (ref 70–99)
Potassium: 3.4 mmol/L — ABNORMAL LOW (ref 3.5–5.1)
Sodium: 139 mmol/L (ref 135–145)

## 2018-11-30 LAB — FOLATE: Folate: 20.4 ng/mL (ref >5.9)

## 2018-11-30 LAB — FERRITIN: Ferritin: 44 ng/mL (ref 24–336)

## 2018-11-30 LAB — VITAMIN B12: Vitamin B-12: 475 pg/mL (ref 180–914)

## 2018-11-30 LAB — MRSA PCR SCREENING: MRSA by PCR: NEGATIVE

## 2018-11-30 MED ORDER — ORAL CARE MOUTH RINSE
15.0000 mL | Freq: Two times a day (BID) | OROMUCOSAL | Status: DC
  Administered 2018-11-30 – 2018-12-05 (×8): 15 mL via OROMUCOSAL

## 2018-11-30 MED ORDER — IPRATROPIUM-ALBUTEROL 0.5-2.5 (3) MG/3ML IN SOLN: 3.0000 mL | RESPIRATORY_TRACT | Status: DC | PRN

## 2018-11-30 MED ORDER — SODIUM CHLORIDE 0.9 % IV SOLN
1.0000 g | Freq: Three times a day (TID) | INTRAVENOUS | Status: DC
  Administered 2018-11-30 – 2018-12-01 (×3): 1 g via INTRAVENOUS
  Filled 2018-11-30 (×4): qty 1

## 2018-11-30 MED ORDER — VANCOMYCIN HCL 10 G IV SOLR
1250.0000 mg | Freq: Two times a day (BID) | INTRAVENOUS | Status: DC
  Administered 2018-11-30 – 2018-12-01 (×2): 1250 mg via INTRAVENOUS
  Filled 2018-11-30 (×3): qty 1250

## 2018-11-30 MED ORDER — RESOURCE THICKENUP CLEAR PO POWD
ORAL | Status: DC | PRN
  Filled 2018-11-30: qty 125
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
## 2018-11-30 NOTE — Progress Notes (Signed)
Pharmacy Antibiotic Note  Joshua Munoz is a 68 y.o. male admitted on 11/29/2018 with hematuria.  Pharmacy has been consulted for vancomycin and cefepime dosing for sepsis.  Per EDP notes, started on ceftriaxone and tamiflu yesterday.  Today, 11/30/18  Day 2 vanc/cefepime  Afebrile  Stable SCr and WBC  Weight updated in [facility]  Plan:  Will change vanc from 1g q12 to 1250mg  IV q12 due to weight change - goal AUC 400-550  Change cefepime from 2g IV q8 to 1g IV q8   Height: 6\' 1"  (185.4 cm) Weight: 174 lb 2.6 oz (79 kg) IBW/kg (Calculated) : 79.9  Temp (24hrs), Avg:98 F (36.7 C), Min:97.4 F (36.3 C), Max:98.6 F (37 C)  Recent Labs  Lab 11/29/18 1355 11/29/18 1814 11/30/18 0545  WBC 6.4  --  4.1  CREATININE 0.76  --  0.46*  LATICACIDVEN 1.3 1.0  --     Estimated Creatinine Clearance: 100.1 mL/min (A) (by C-G formula based on SCr of 0.46 mg/dL (L)).    No Known Allergies  Antimicrobials this admission:  3/20 Flagyl x 1 3/20 Vancomycin >> 3/20 Cefepime >>  Dose adjustments this admission:   Microbiology results:  3/20 BCx: sent 3/20 UCx: sent 3/20 HIV Ab: 3/20 MRSA PCR: negative  Thank you for allowing pharmacy to be a part of this patient's care.  Hessie Knows, PharmD, BCPS Pager [phone] 11/30/2018 11:22 AM
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
## 2018-12-01 LAB — CBC
HCT: 32.9 % — ABNORMAL LOW (ref 39.0–52.0)
Hemoglobin: 10.4 g/dL — ABNORMAL LOW (ref 13.0–17.0)
MCH: 31.9 pg (ref 26.0–34.0)
MCHC: 31.6 g/dL (ref 30.0–36.0)
MCV: 100.9 fL — ABNORMAL HIGH (ref 80.0–100.0)
Platelets: 149 10*3/uL — ABNORMAL LOW (ref 150–400)
RBC: 3.26 MIL/uL — ABNORMAL LOW (ref 4.22–5.81)
RDW: 12.2 % (ref 11.5–15.5)
WBC: 4.1 10*3/uL (ref 4.0–10.5)
nRBC: 0 % (ref 0.0–0.2)

## 2018-12-01 LAB — BASIC METABOLIC PANEL
Anion gap: 7 (ref 5–15)
BUN: 12 mg/dL (ref 8–23)
CO2: 27 mmol/L (ref 22–32)
Calcium: 8 mg/dL — ABNORMAL LOW (ref 8.9–10.3)
Chloride: 106 mmol/L (ref 98–111)
Creatinine, Ser: 0.52 mg/dL — ABNORMAL LOW (ref 0.61–1.24)
GFR calc Af Amer: >60 mL/min (ref >60)
GFR calc non Af Amer: >60 mL/min (ref >60)
Glucose, Bld: 147 mg/dL — ABNORMAL HIGH (ref 70–99)
Potassium: 3.6 mmol/L (ref 3.5–5.1)
Sodium: 140 mmol/L (ref 135–145)

## 2018-12-01 LAB — CREATININE, SERUM
Creatinine, Ser: 0.48 mg/dL — ABNORMAL LOW (ref 0.61–1.24)
GFR calc Af Amer: >60 mL/min (ref >60)

## 2018-12-01 LAB — HIV ANTIBODY (ROUTINE TESTING W REFLEX): HIV Screen 4th Generation wRfx: NONREACTIVE

## 2018-12-01 LAB — URINE CULTURE: CULTURE: NO GROWTH

## 2018-12-01 MED ORDER — AZITHROMYCIN 250 MG PO TABS
500.0000 mg | ORAL_TABLET | Freq: Every day | ORAL | Status: DC
  Administered 2018-12-01 – 2018-12-02 (×2): 500 mg via ORAL
  Filled 2018-12-01 (×2): qty 2

## 2018-12-01 MED ORDER — POLYETHYLENE GLYCOL 3350 17 G PO PACK
17.0000 g | PACK | Freq: Four times a day (QID) | ORAL | Status: DC
  Administered 2018-12-01 – 2018-12-02 (×5): 17 g via ORAL
  Filled 2018-12-01 (×5): qty 1

## 2018-12-01 MED ORDER — SODIUM CHLORIDE 0.9 % IV SOLN
1.0000 g | INTRAVENOUS | Status: DC
  Administered 2018-12-01 – 2018-12-04 (×4): 1 g via INTRAVENOUS
  Filled 2018-12-01 (×3): qty 1
  Filled 2018-12-01: qty 10
  Filled 2018-12-01: qty 1

## 2018-12-01 NOTE — Progress Notes (Signed)
Soap suds enema given.  Waiting for results
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
## 2018-12-01 NOTE — Plan of Care (Signed)
Patient lying in bed turned towards right side; arms contracted. Asleep initially upon entering room, but arouses easily to voice. Nods yes or no in response to questions. Bilateral foot drop; end of bed extended because patient's feet were resting against end. Pt nods yes that he is more comfortable now. Will continue to monitor.

## 2018-12-02 ENCOUNTER — Inpatient Hospital Stay (HOSPITAL_COMMUNITY): Payer: Medicare Other

## 2018-12-02 LAB — CREATININE, SERUM
Creatinine, Ser: 0.51 mg/dL — ABNORMAL LOW (ref 0.61–1.24)
GFR calc Af Amer: >60 mL/min (ref >60)
GFR calc non Af Amer: >60 mL/min (ref >60)

## 2018-12-02 NOTE — Progress Notes (Signed)
During medication administration Pt seemed to cough when given Nectar thick liquids. Provider paged with update. Will continue to monitor
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
## 2018-12-02 NOTE — Progress Notes (Signed)
Patient received as transfer from ICU.  Patient stable VSS, placed on telemetry and verified with CMT.  Patient oriented to equipment and unit.  Bed alarm on d/t hx of low BP during hospital stay.  Will continue to monitor.

## 2018-12-02 NOTE — Progress Notes (Signed)
Pt sleeping but easily aroused, follows commands appropriately, VSS, resp even and unlabored, skin pink/warm/dry, in NAD. Pt had uneventful shift, tolerated PO intake well with more than adequate output, no complaints of pain or discomfort and pt had restful sleep.

## 2018-12-02 NOTE — TOC Initial Note (Signed)
Transition of Care [facility]) - Initial/Assessment Note    Patient Details  Name: Joshua Munoz MRN: [id number] Date of Birth: 01-12-1951  [facility]) CM/SW Contact:    Althea Charon, LCSW Phone Number: 12/02/2018, 10:08 AM  Clinical Narrative:    Clinical Social Worker following patient for support and discharge needs. Patient is from [facility] long term section. CSW attempted to contact patient sister Victorino Dike via phone. CSW unable to get a hold of sister, CSW left voicemail on sisters phone to make her aware of patient being medically stable to transition back to [facility].  CSW spoke with admission coordinator to make sure patient is able to come back. Per Everardo Pacific she stated patient can come back once DC summary has been written and signed by MD             Expected Discharge Plan: Skilled Nursing Facility Barriers to Discharge: No Barriers Identified   Patient Goals and CMS Choice Patient states their goals for this hospitalization and ongoing recovery are:: (pt at LTC at [facility])      Expected Discharge Plan and Services Expected Discharge Plan: Skilled Nursing Facility In-house Referral: Clinical Social Work   Post Acute Care Choice: Skilled Nursing Facility Living arrangements for the past 2 months: Skilled Nursing Facility Expected Discharge Date: (unknown)                        Prior Living Arrangements/Services Living arrangements for the past 2 months: Skilled Nursing Facility Lives with:: Facility Resident Patient language and need for interpreter reviewed:: Yes        Need for Family Participation in Patient Care: Yes (Comment) Care giver support system in place?: Yes (comment)   Criminal Activity/Legal Involvement Pertinent to Current Situation/Hospitalization: No - Comment as needed  Activities of Daily Living Home Assistive Devices/Equipment: Other (Comment)(pt unable to speak, no family present) ADL Screening (condition at time of  admission) Patient's cognitive ability adequate to safely complete daily activities?: No Is the patient deaf or have difficulty hearing?: No Does the patient have difficulty seeing, even when wearing glasses/contacts?: No Does the patient have difficulty concentrating, remembering, or making decisions?: Yes Patient able to express need for assistance with ADLs?: No Does the patient have difficulty dressing or bathing?: Yes Independently performs ADLs?: No Communication: Needs assistance Is this a change from baseline?: Pre-admission baseline Dressing (OT): Needs assistance Is this a change from baseline?: Pre-admission baseline Grooming: Needs assistance Is this a change from baseline?: Pre-admission baseline Feeding: Needs assistance Is this a change from baseline?: Pre-admission baseline Bathing: Needs assistance Is this a change from baseline?: Pre-admission baseline Toileting: Needs assistance Is this a change from baseline?: Pre-admission baseline In/Out Bed: Needs assistance Is this a change from baseline?: Pre-admission baseline Walks in Home: Dependent Is this a change from baseline?: Pre-admission baseline Does the patient have difficulty walking or climbing stairs?: Yes Weakness of Legs: Both Weakness of Arms/Hands: Both  Permission Sought/Granted Permission sought to share information with : Family Supports Permission granted to share information with : Yes, Verbal Permission Granted  Share Information with NAME: Boris Sharper  Permission granted to share info w AGENCY: [facility]  Permission granted to share info w Relationship: sister  Permission granted to share info w Contact Information: [phone]  Emotional Assessment Appearance:: Appears stated age Attitude/Demeanor/Rapport: Unable to Assess Affect (typically observed): Unable to Assess Orientation: : Oriented to Self Alcohol / Substance Use: Not Applicable Psych Involvement:  No  (comment)  Admission diagnosis:  Gall stones [K80.20] Healthcare-associated pneumonia [J18.9] Hypotension, unspecified hypotension type [I95.9] Constipation, unspecified constipation type [K59.00] Sepsis (HCC) [A41.9] Patient Active Problem List   Diagnosis Date Noted  . UTI (urinary tract infection) 11/30/2018  . CAP (community acquired pneumonia) 11/30/2018  . Urethral injury 11/30/2018  . Sepsis (HCC) 11/29/2018  . Pure hypercholesterolemia 09/20/2016  . History of traumatic brain injury 05/25/2016  . Colon cancer screening 05/25/2016  . Chronic pain syndrome 05/25/2016  . 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 Other dysphagia 12/21/2015  . Major depression, chronic 12/21/2015  . Seizure disorder (HCC) 03/10/2015  . Depression with anxiety 03/10/2015  . Protein-calorie malnutrition (HCC) 03/10/2015  . Seasonal allergies 03/10/2015  . Right hemiplegia (HCC) 08/01/2013  . 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 Depression 12/09/2012  . Anxiety 12/09/2012  . Seizures (HCC) 12/09/2012  . Constipation 12/09/2012  . Pain, generalized 12/09/2012  . Otitis media 12/09/2012   PCP:  Patient, No Pcp Per Pharmacy:  No Pharmacies Listed    Social Determinants of Health (SDOH) Interventions    Readmission Risk Interventions No flowsheet data found.

## 2018-12-03 ENCOUNTER — Inpatient Hospital Stay (HOSPITAL_COMMUNITY): Payer: Medicare Other

## 2018-12-03 LAB — CREATININE, SERUM
Creatinine, Ser: 0.44 mg/dL — ABNORMAL LOW (ref 0.61–1.24)
GFR calc non Af Amer: >60 mL/min (ref >60)

## 2018-12-03 MED ORDER — LORAZEPAM 2 MG/ML IJ SOLN
0.5000 mg | Freq: Every day | INTRAMUSCULAR | Status: DC
  Administered 2018-12-03 – 2018-12-05 (×3): 0.5 mg via INTRAVENOUS
  Filled 2018-12-03 (×3): qty 1

## 2018-12-03 MED ORDER — SODIUM CHLORIDE 0.9 % IV SOLN
500.0000 mg | INTRAVENOUS | Status: AC
  Administered 2018-12-03 – 2018-12-04 (×2): 500 mg via INTRAVENOUS
  Filled 2018-12-03 (×3): qty 500

## 2018-12-03 MED ORDER — PHENOBARBITAL SODIUM 130 MG/ML IJ SOLN
97.2000 mg | Freq: Every day | INTRAMUSCULAR | Status: DC
  Administered 2018-12-03 – 2018-12-04 (×2): 97.2 mg via INTRAVENOUS
  Filled 2018-12-03: qty 1
  Filled 2018-12-03: qty 0.75
  Filled 2018-12-03: qty 1

## 2018-12-03 NOTE — Care Management Important Message (Signed)
Important Message  Patient Details  Name: Joshua Munoz MRN: [id number] Date of Birth: [birth year]   Medicare Important Message Given:  Yes    Caren Macadam 12/03/2018, 12:45 PMImportant Message  Patient Details  Name: Joshua Munoz MRN: [id number] Date of Birth: 06/16/1951   Medicare Important Message Given:  Yes    Caren Macadam 12/03/2018, 12:45 PM

## 2018-12-03 NOTE — Progress Notes (Signed)
SlP received page from RN regarding pt's dysphagia.  As pt has significant h/o dysphagia and aspiration and overt deficits clinically, recommend proceed with MBS in am.  Also pt may benefit from consideration for palliative referral to help establish goals for this pt with chronic medical issues.  Spoke to [place] with recommendations for [facility] 3/25 and maintain npo (oral care) pending test.  Also messaged Md re: recommendations.  Thanks.   Donavan Burnet, MS [facility] SLP Acute Rehab Services Pager [phone] Office [phone]
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
## 2018-12-03 NOTE — Progress Notes (Signed)
Text paged Dr. Caleb Popp about patient demonstrating difficulty swallowing when giving just a half spoon of applesauce.  Patient worked at trying to cough, but cough was weak.  RN held all meds due to risk for aspiration.

## 2018-12-03 NOTE — Progress Notes (Signed)
When giving pt nectar thick liquids with his 2200 meds, pt began coughing. This RN then tried giving his meds with applesauce. Pt also began coughing after med administration and his breathing sounded wet. RN tried to get pt to cough to clear throat, but cough was very weak. This RN held both doses of miralax d/t potential aspiration.

## 2018-12-03 NOTE — TOC Progression Note (Signed)
Transition of Care [facility]) - Progression Note    Patient Details  Name: Joshua Munoz MRN: [id number] Date of Birth: 05-21-1951  Transition of [facility]) CM/SW Contact  Dawnita Molner, Olegario Messier, RN Phone Number: 12/03/2018, 3:14 PM  Clinical Narrative:  Noted ST recc-dysphagia,MBS,palliative cons-MD aware.Patient currently from [place]-LTC-will continue to follow for referrals,& d/c plans.    Expected Discharge Plan: Skilled Nursing Facility Barriers to Discharge: No Barriers Identified  Expected Discharge Plan and Services Expected Discharge Plan: Skilled Nursing Facility In-house Referral: Clinical Social Work   Post Acute Care Choice: Skilled Nursing Facility Living arrangements for the past 2 months: Skilled Nursing Facility Expected Discharge Date: (unknown)                         Social Determinants of Health (SDOH) Interventions    Readmission Risk Interventions No flowsheet data found.

## 2018-12-04 ENCOUNTER — Inpatient Hospital Stay (HOSPITAL_COMMUNITY): Payer: Medicare Other

## 2018-12-04 DIAGNOSIS — Z7189 Other specified counseling: Secondary | ICD-10-CM

## 2018-12-04 DIAGNOSIS — Z515 Encounter for palliative care: Secondary | ICD-10-CM

## 2018-12-04 DIAGNOSIS — J189 Pneumonia, unspecified organism: Principal | ICD-10-CM

## 2018-12-04 DIAGNOSIS — K5900 Constipation, unspecified: Secondary | ICD-10-CM

## 2018-12-04 LAB — BASIC METABOLIC PANEL
ANION GAP: 7 (ref 5–15)
BUN: 10 mg/dL (ref 8–23)
CALCIUM: 8.1 mg/dL — AB (ref 8.9–10.3)
CO2: 26 mmol/L (ref 22–32)
Chloride: 107 mmol/L (ref 98–111)
Creatinine, Ser: 0.53 mg/dL — ABNORMAL LOW (ref 0.61–1.24)
GFR calc Af Amer: >60 mL/min (ref >60)
GFR calc non Af Amer: >60 mL/min (ref >60)
Glucose, Bld: 86 mg/dL (ref 70–99)
Potassium: 4.6 mmol/L (ref 3.5–5.1)
Sodium: 140 mmol/L (ref 135–145)

## 2018-12-04 LAB — CULTURE, BLOOD (ROUTINE X 2)
CULTURE: NO GROWTH
Culture: NO GROWTH
Special Requests: ADEQUATE

## 2018-12-04 LAB — CBC WITH DIFFERENTIAL/PLATELET
Abs Immature Granulocytes: 0.04 10*3/uL (ref 0.00–0.07)
BASOS PCT: 1 %
Basophils Absolute: 0 10*3/uL (ref 0.0–0.1)
Eosinophils Absolute: 0.2 10*3/uL (ref 0.0–0.5)
Eosinophils Relative: 4 %
HCT: 33.4 % — ABNORMAL LOW (ref 39.0–52.0)
Hemoglobin: 10.3 g/dL — ABNORMAL LOW (ref 13.0–17.0)
Immature Granulocytes: 1 %
Lymphocytes Relative: 24 %
Lymphs Abs: 1.3 10*3/uL (ref 0.7–4.0)
MCH: 32.2 pg (ref 26.0–34.0)
MCHC: 30.8 g/dL (ref 30.0–36.0)
MCV: 104.4 fL — ABNORMAL HIGH (ref 80.0–100.0)
MONO ABS: 0.4 10*3/uL (ref 0.1–1.0)
Monocytes Relative: 7 %
NEUTROS ABS: 3.5 10*3/uL (ref 1.7–7.7)
Neutrophils Relative %: 63 %
Platelets: 234 10*3/uL (ref 150–400)
RBC: 3.2 MIL/uL — AB (ref 4.22–5.81)
RDW: 12.1 % (ref 11.5–15.5)
WBC: 5.5 10*3/uL (ref 4.0–10.5)
nRBC: 0 % (ref 0.0–0.2)

## 2018-12-04 LAB — CBC

## 2018-12-04 NOTE — Progress Notes (Signed)
MBS done, GROSS dysphagia with gross aspiration and delayed ineffective cough (likely when barium reached carina).  Aspiration of honey and nectar occurred due to delayed pharyngeal swallow - with boluses filling pyriform sinus and spilling into airway.  Intake is not comfortable for him due to respiratory difficulties - ongoing cough for greater than 4 minutes.  Pt nodded head to indicate that he did not like to eat due to his ongoing aspiration.  Highly recommend palliative consult as this pt will aspirate regardless of diet and per MOST for does not desire feeding tube.  Sad case, he clearly indicated to me during MBS that he did not want anymore and nothing will prevent him from aspirating.    Donavan Burnet, MS [facility] SLP Acute Rehab Services Pager [phone] Office [phone]

## 2018-12-04 NOTE — Progress Notes (Signed)
Patients first AM lab draw showed a critical platelet level of 9. No bleeding, vital signs stable, patient alert and responsive at his basline. Donnamarie Poag NP made aware. New order received for STAT cbc w/diff. New results show platelet of 234. Donnamarie Poag NP notified.
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
## 2018-12-04 NOTE — TOC Progression Note (Signed)
Transition of Care [facility]) - Progression Note    Patient Details  Name: Joshua Munoz MRN: [id number] Date of Birth: 12/10/1950  Transition of Care [facility]) CM/SW Contact  Shelly Shoultz, Olegario Messier, RN Phone Number: 12/04/2018, 10:41 AM  Clinical Narrative: Noted-ST eval, & recc-severe dysphagia,palliative. Spoke to patient-asked if CM could contact his sister jennifer to update on current status-he agreed. TC, & left vm w/[name] h#[phone], & c# [phone]-await call back.      Expected Discharge Plan: Skilled Nursing Facility Barriers to Discharge: No Barriers Identified  Expected Discharge Plan and Services Expected Discharge Plan: Skilled Nursing Facility In-house Referral: Clinical Social Work   Post Acute Care Choice: Skilled Nursing Facility Living arrangements for the past 2 months: Skilled Nursing Facility Expected Discharge Date: (unknown)                         Social Determinants of Health (SDOH) Interventions    Readmission Risk Interventions No flowsheet data found.

## 2018-12-04 NOTE — Progress Notes (Signed)
CRITICAL VALUE ALERT  Critical Value:  Platelets 9  Date & Time Notied:  12/04/2018 @ 0531  Provider Notified: Donnamarie Poag NP  Orders Received/Actions taken: new orders received for repeat CBC w/diff STAT

## 2018-12-04 NOTE — Progress Notes (Signed)
   12/04/18 1434  Clinical Encounter Type  Visited With Patient  Visit Type Initial  Referral From Chaplain;Palliative care team  Consult/Referral To Chaplain  The chaplain participated in initial visit with PMT-MD.  The chaplain introduced herself to the Pt. The Pt. responded to the chaplain with a head nod.  The Pt. agreed to his relationship with his sister-[name]. The chaplain saw the Pt. acknowledge God with a head nod and a smile as the chaplain shared why she was present. The Pt. agreed to the chaplain praying for him.  The chaplain is available for F/U spiritual care as needed.

## 2018-12-04 NOTE — Progress Notes (Signed)
Modified Barium Swallow Progress Note  Patient Details  Name: Joshua Munoz MRN: [id number] Date of Birth: 05-30-1951  Today's Date: 12/04/2018  Modified Barium Swallow completed.  Full report located under Chart Review in the Imaging Section.  Brief recommendations include the following:  Clinical Impression  GROSS dysphagia with gross aspiration and delayed ineffective cough (likely when barium reached carina). Delayed oral transiting noted with pt bobbing head back and forth to help oral transiting.   Aspiration of honey and nectar occurred due to delayed pharyngeal swallow - with boluses filling pyriform sinus and spilling into airway.  Intake is not comfortable for him due to respiratory difficulties - ongoing cough for greater than 4 minutes.  Use of syringe with chin down posture worsened airway protction.  Pt nodded head to indicate that he did not like to eat due to his ongoing aspiration.  Highly recommend palliative consult as this pt will aspirate regardless of diet and per MOST for does not desire feeding tube.  Sad case, he clearly indicated to me during MBS that he did not want any more po.  Due pt's severe oropharyngeal dysphagia, he will aspirate regardless of consistency - and thicker bolus aspiration will be more difficult for pt to clear lungs.     If pt's goal becomes comfort; tsps of nectar liquids may provide him hydration and increased QOL without overtly aspirating.  SLP did not test thin due to pt's discomfort with gross aspiration. Of note, pt only sensed gross aspiration = not mild to mid amounts.  Oral care will be important for this pt.     Swallow Evaluation Recommendations       SLP Diet Recommendations: NPO(oral moisture via toothette and aggressive oral care)   Liquid Administration via: Other (Comment)(moisture via toothette pending PMR goals )   Medication Administration: Via alternative means               Oral Care Recommendations: Oral care  QID   Other Recommendations: Have oral suction available  Donavan Burnet, MS [facility] SLP Acute Rehab Services Pager [phone] Office [phone]  Chales Abrahams 12/04/2018,10:22 AM
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
## 2018-12-05 LAB — CREATININE, SERUM
Creatinine, Ser: 0.54 mg/dL — ABNORMAL LOW (ref 0.61–1.24)
GFR calc Af Amer: >60 mL/min (ref >60)
GFR calc non Af Amer: >60 mL/min (ref >60)

## 2018-12-05 NOTE — TOC Transition Note (Signed)
Transition of Care [facility]) - CM/SW Discharge Note   Patient Details  Name: Joshua Munoz MRN: [id number] Date of Birth: 03/27/1951  [facility] of Care [facility]) CM/SW Contact:  Lanier Clam, RN Phone Number: 12/05/2018, 1:51 PM   Clinical Narrative: [facility]-faxed d/c summary to [facility] to call report to [phone] forms on shadow chart-called [facility] for transport. No further CM needs.     Final next level of care: Hospice Medical Facility Barriers to Discharge: No Barriers Identified   Patient Goals and CMS Choice Patient states their goals for this hospitalization and ongoing recovery are:: (go home) CMS Medicare.gov Compare Post Acute Care list provided to:: Patient Represenative (must comment)(sister Victorino Dike) Choice offered to / list presented to : Sibling  Discharge Placement                       Discharge Plan and Services In-house Referral: Clinical Social Work   Post Acute Care Choice: Skilled Nursing Facility                    Social Determinants of Health (SDOH) Interventions     Readmission Risk Interventions No flowsheet data found.

## 2018-12-05 NOTE — Progress Notes (Signed)
At 1402, Judeth Cornfield, a nurse from [facility] was provided with report. After report, the nurse reported no further questions or concerns. The pt is currently waiting on [facility] for transport to the facility.

## 2018-12-05 NOTE — Discharge Summary (Addendum)
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   --  107  --   CO2 28 26  --  27  --   --  26  --   GLUCOSE 130* 105*  --  147*  --   --  86  --   BUN 20 16  --  12  --   --  10  --   CREATININE 0.76 0.46*   < > 0.52* 0.51* 0.44* 0.53* 0.54*  CALCIUM 7.2* 7.4*  --  8.0*  --   --  8.1*  --    < > = values in this interval not displayed.   Liver Function Tests: Recent Labs  Lab 11/29/18 1355  AST 19  ALT 15  ALKPHOS 75  BILITOT 0.2*  PROT 4.9*  ALBUMIN 2.6*   Recent Labs  Lab 11/29/18 1355  LIPASE 29   No results for input(s): AMMONIA in the last 168 hours. CBC: Recent Labs  Lab 11/29/18 1355 11/30/18 0545 12/01/18 1333 12/04/18 0444 12/04/18 0548  WBC 6.4 4.1 4.1 QUESTIONABLE RESULTS, RECOMMEND RECOLLECT TO VERIFY 5.5  NEUTROABS 5.1  --   --   --  3.5  HGB 10.1* 9.0* 10.4* QUESTIONABLE RESULTS, RECOMMEND RECOLLECT TO VERIFY 10.3*  HCT 32.2*  30.0* 32.9* QUESTIONABLE RESULTS, RECOMMEND RECOLLECT TO VERIFY 33.4*  MCV 103.9* 107.1* 100.9* QUESTIONABLE RESULTS, RECOMMEND RECOLLECT TO VERIFY 104.4*  PLT 147* 134* 149* QUESTIONABLE RESULTS, RECOMMEND RECOLLECT TO VERIFY 234   Cardiac Enzymes: No results for input(s): CKTOTAL, CKMB, CKMBINDEX, TROPONINI in the last 168 hours. BNP: Invalid input(s): POCBNP CBG: No results for input(s): GLUCAP in the last 168 hours. D-Dimer No results for input(s): DDIMER in the last 72 hours. Hgb A1c No results for input(s): HGBA1C in the last 72 hours. Lipid Profile No results for input(s): CHOL, HDL, LDLCALC, TRIG, CHOLHDL, LDLDIRECT in the last 72 hours. Thyroid function studies No results for input(s): TSH, T4TOTAL, T3FREE, THYROIDAB in the last 72  hours.  Invalid input(s): FREET3 Anemia work up No results for input(s): VITAMINB12, FOLATE, FERRITIN, TIBC, IRON, RETICCTPCT in the last 72 hours. Urinalysis    Component Value Date/Time   COLORURINE AMBER (A) 11/29/2018 1613   APPEARANCEUR CLOUDY (A) 11/29/2018 1613   LABSPEC 1.014 11/29/2018 1613   PHURINE 5.0 11/29/2018 1613   GLUCOSEU NEGATIVE 11/29/2018 1613   HGBUR LARGE (A) 11/29/2018 1613   BILIRUBINUR NEGATIVE 11/29/2018 1613   KETONESUR NEGATIVE 11/29/2018 1613   PROTEINUR 30 (A) 11/29/2018 1613   NITRITE NEGATIVE 11/29/2018 1613   LEUKOCYTESUR MODERATE (A) 11/29/2018 1613   Sepsis Labs Invalid input(s): PROCALCITONIN,  WBC,  LACTICIDVEN Microbiology Recent Results (from the past 240 hour(s))  Culture, blood (routine x 2)     Status: None   Collection Time: 11/29/18  1:49 PM  Result Value Ref Range Status   Specimen Description   Final    BLOOD LEFT ANTECUBITAL Performed at [facility], [address] [place], [place] [address]    Special Requests   Final    BOTTLES DRAWN AEROBIC AND ANAEROBIC Blood Culture adequate volume Performed at [facility], [address],  [place], [place] [address]    Culture   Final    NO GROWTH 5 DAYS Performed at [facility], [address] [place], [place] [address]    Report Status 12/04/2018 FINAL  Final  Culture, blood (routine x 2)     Status: None   Collection Time: 11/29/18  1:55 PM  Result Value Ref Range Status   Specimen Description   Final    BLOOD LEFT HAND Performed at [facility], [address] [place], [place] [address]    Special Requests   Final    BOTTLES DRAWN AEROBIC ONLY Blood Culture results may not be optimal due to an inadequate volume of blood received in culture bottles Performed at [facility], [address] [place], [place] [address]    Culture   Final    NO GROWTH 5 DAYS Performed at [facility], [address], [place], [place] [address]    Report Status 12/04/2018 FINAL  Final  Urine culture     Status: None   Collection Time: 11/29/18  4:13 PM  Result Value Ref Range Status   Specimen Description   Final    URINE, CATHETERIZED Performed at [facility], [address], [place], [place] [address]    Special Requests   Final    NONE Performed at [facility], [address], [place], [place] [address]    Culture   Final    NO GROWTH Performed at [facility], [address], [place], [place] [address]    Report Status 12/01/2018 FINAL  Final  MRSA PCR Screening     Status: None   Collection Time: 11/30/18 12:08 AM  Result Value Ref Range Status   MRSA by PCR NEGATIVE NEGATIVE Final    Comment:        The GeneXpert MRSA Assay (FDA approved for NASAL specimens only), is one component of a comprehensive MRSA colonization surveillance program. It is not intended to diagnose MRSA infection nor to guide or monitor treatment for MRSA infections. Performed at [facility], [address], [place], [place] [address]    Time spent: 30  min  SIGNED:   Rickey Barbara, MD  [facility] 12/05/2018, 11:51 AM  If 7PM-7AM, please  contact night-coverage

## 2018-12-05 NOTE — Progress Notes (Signed)
SLP to sign off as pt comfort care and plans to dc to [facility] per Md.    Donavan Burnet, MS [facility] SLP Acute Rehab Services Pager [phone] Office [phone]

## 2018-12-05 NOTE — TOC Progression Note (Signed)
Transition of Care [facility]) - Progression Note    Patient Details  Name: Joshua Munoz MRN: [id number] Date of Birth: 1950/10/18  Transition of Care [facility]) CM/SW Contact  Yeva Bissette, Olegario Messier, RN Phone Number: 12/05/2018, 9:57 AM  Clinical Narrative: Referral to Residential hospice-sister chose-[facility] referral line-await eval & if able to accept.Sister Audrie Gallus HPCOA-contact person-h#[phone], c#[phone].      Expected Discharge Plan: Hospice Medical Facility Barriers to Discharge: No Barriers Identified  Expected Discharge Plan and Services Expected Discharge Plan: Hospice Medical Facility In-house Referral: Clinical Social Work   Post Acute Care Choice: Skilled Nursing Facility Living arrangements for the past 2 months: Skilled Nursing Facility Expected Discharge Date: (unknown)                         Social Determinants of Health (SDOH) Interventions    Readmission Risk Interventions No flowsheet data found.

## 2018-12-05 NOTE — Progress Notes (Signed)
Manufacturing engineer  Received request for residential hospice at [facility] from [facility].  Spoke with sister, Chapman Fitch, confirmed interest.  We do have a bed to offer today.  Met with [facility] Malta, completed necessary registration paperwork.  Dr. Orpah Melter to assume care, per request of family.    RN:  Please call report to [phone]  Please fax d/c summary once complete to [phone]  Thank you, Venia Carbon RN, BSN, [facility] Liaison  [phone]

## 2018-12-05 NOTE — Progress Notes (Signed)
Nutrition Brief Note  Patient last seen by a RD on 3/22. Chart reviewed. Pt now transitioning to comfort care. Plan is to transfer to [facility] when a bed is available.  No further nutrition interventions warranted at this time.  Please re-consult as needed.     Trenton Gammon, MS, RD, LDN, [facility] Inpatient Clinical Dietitian Pager # [phone] After hours/weekend pager # [phone]

## 2019-08-12 DEATH — deceased

## 2020-04-14 IMAGING — DX PORTABLE CHEST - 1 VIEW
1 series · 1 of 1 positions shown · non-contrast
Comparison: Chest CT June 09, 2008

CLINICAL DATA: Fever

EXAM:
PORTABLE CHEST 1 VIEW

[chest ap]
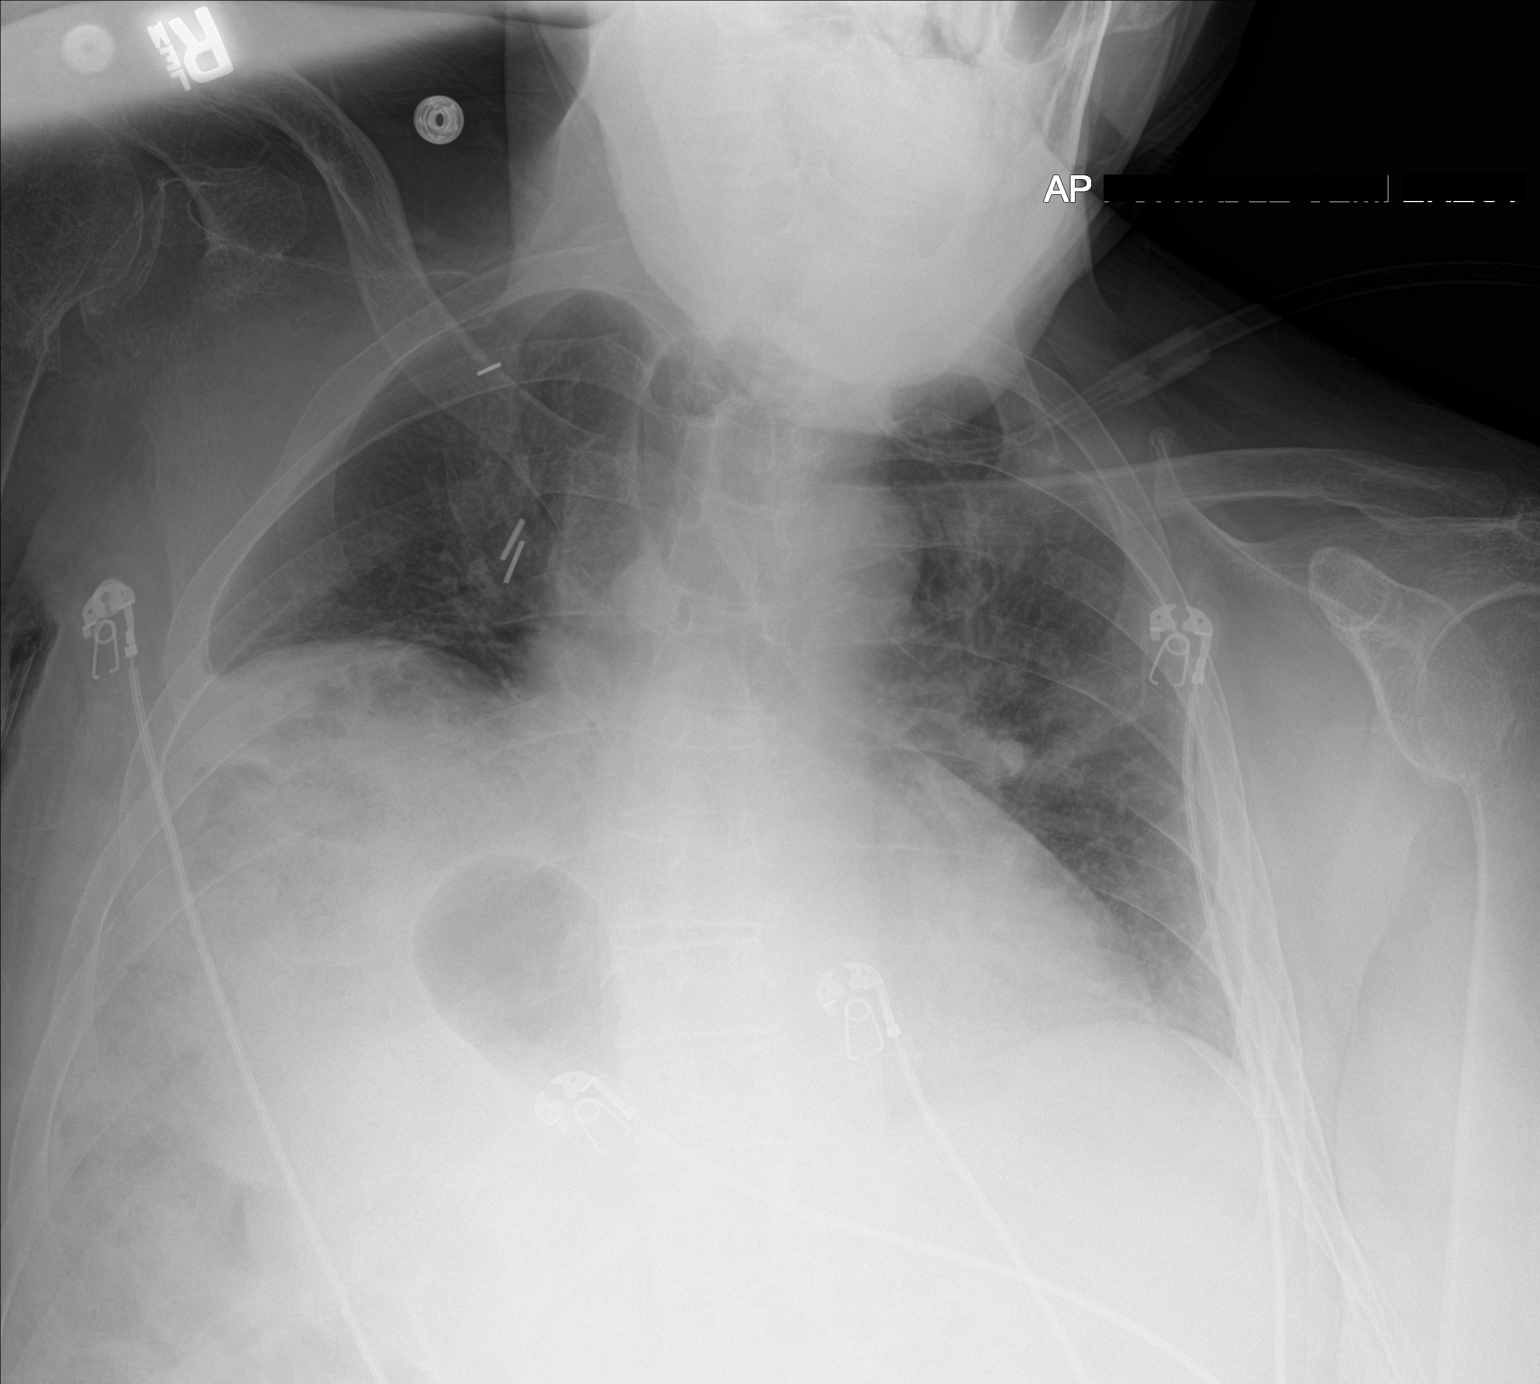

[1 of 1 positions shown; findings below may reference images not displayed]

FINDINGS: There is chronic elevation of the right hemidiaphragm. There is
atelectatic change in the right base. There is no appreciable edema
or consolidation. Heart size and pulmonary vascularity are normal.
No adenopathy. No bone lesions. There are surgical clips on the
right.
IMPRESSION: Chronic volume loss right lung with elevation of right
hemidiaphragm. Atelectasis right base. No lung edema or
consolidation. Heart size within normal limits.

## 2020-04-15 IMAGING — US RENAL/URINARY TRACT ULTRASOUND
1 series · 13 of 25 positions shown · non-contrast
Comparison: CT 11/29/2018

CLINICAL DATA: Renal cysts.  Previous right nephrectomy.

EXAM:
RENAL / URINARY TRACT ULTRASOUND COMPLETE

[Series 1: renal/urinary tract ultrasound · 13 of 41 slices shown]
[im 1/41]
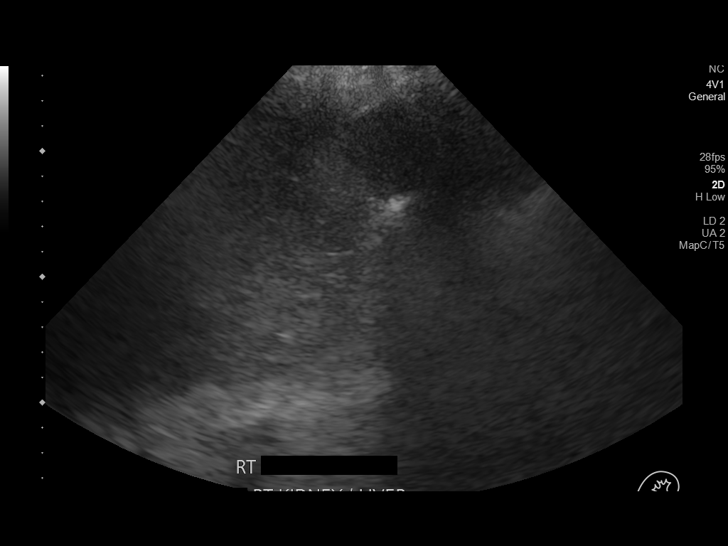
[im 4/41]
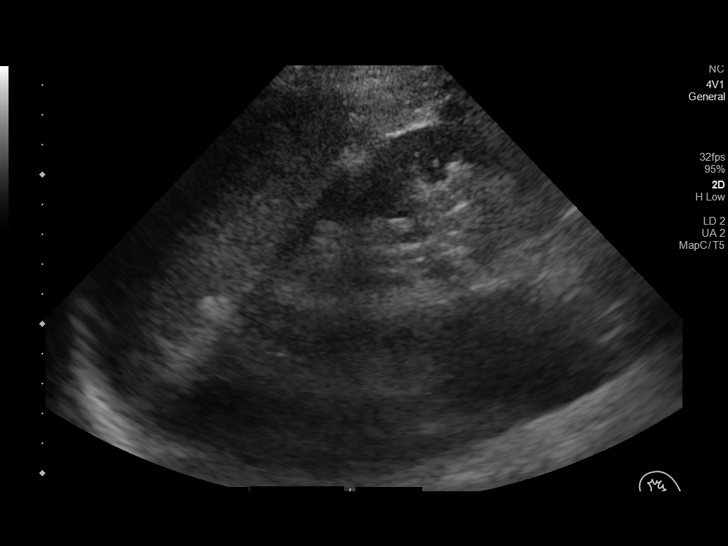
[im 7/41]
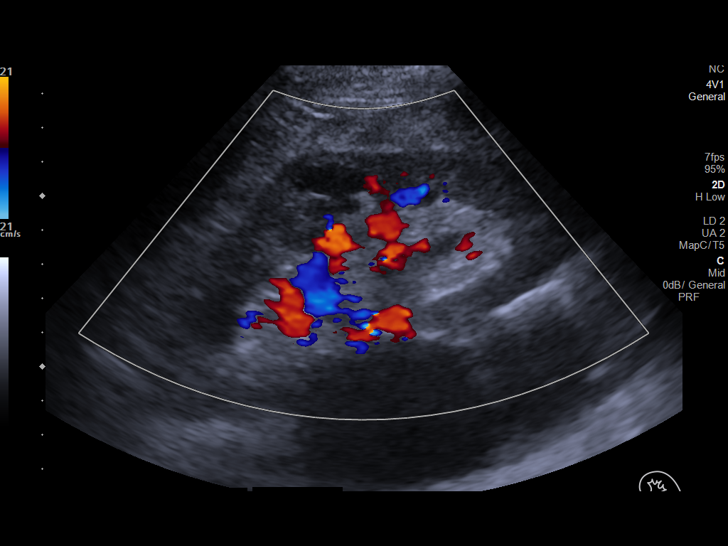
[im 11/41]
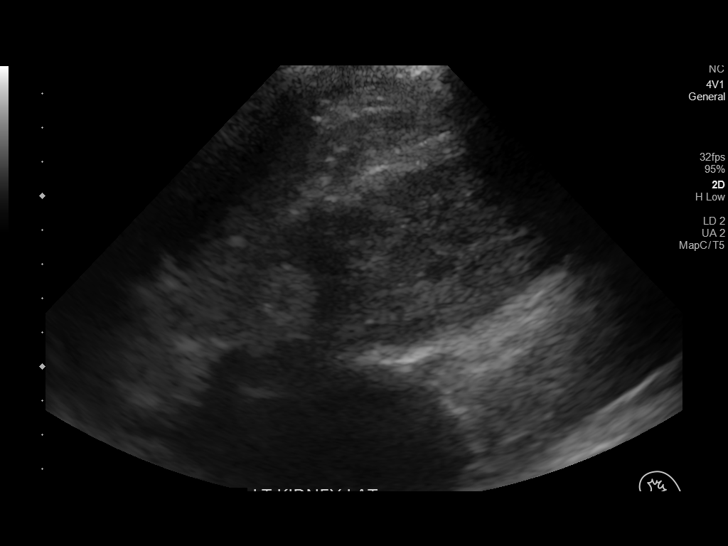
[im 14/41]
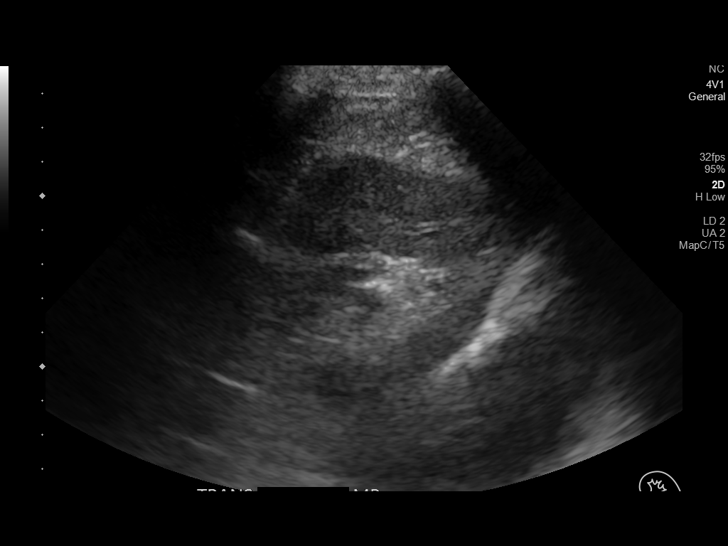
[im 17/41]
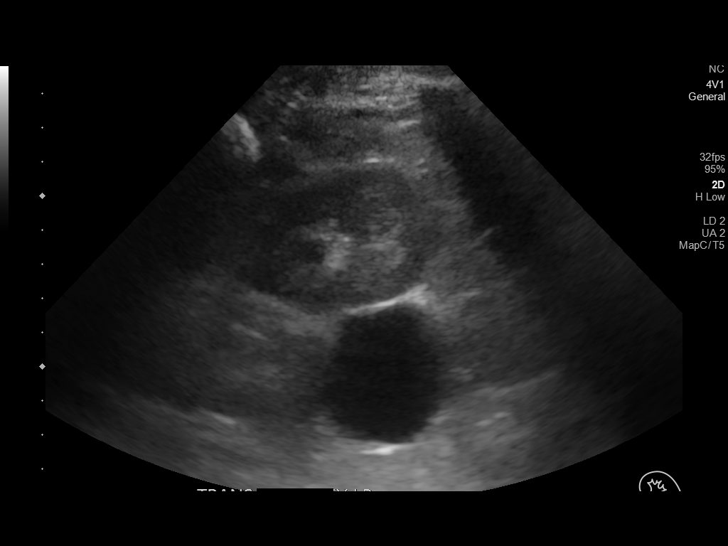
[im 21/41]
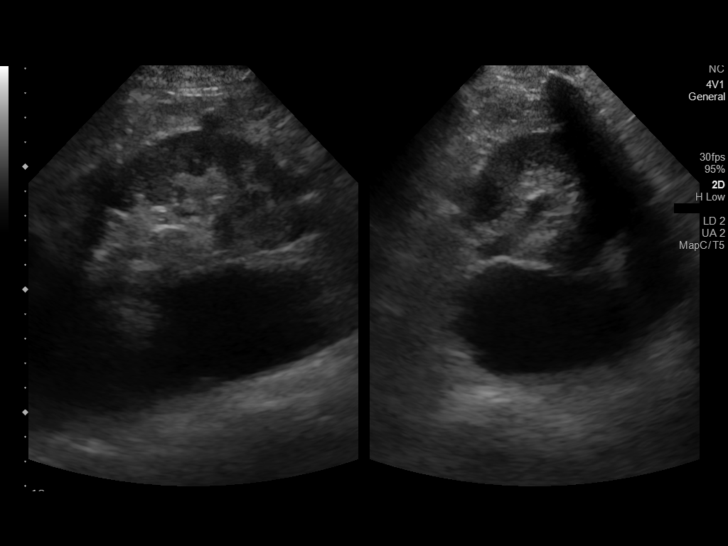
[im 24/41]
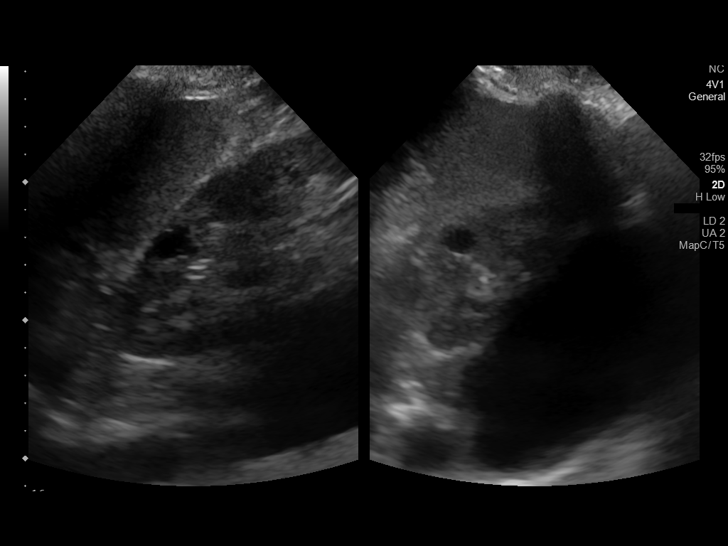
[im 27/41]
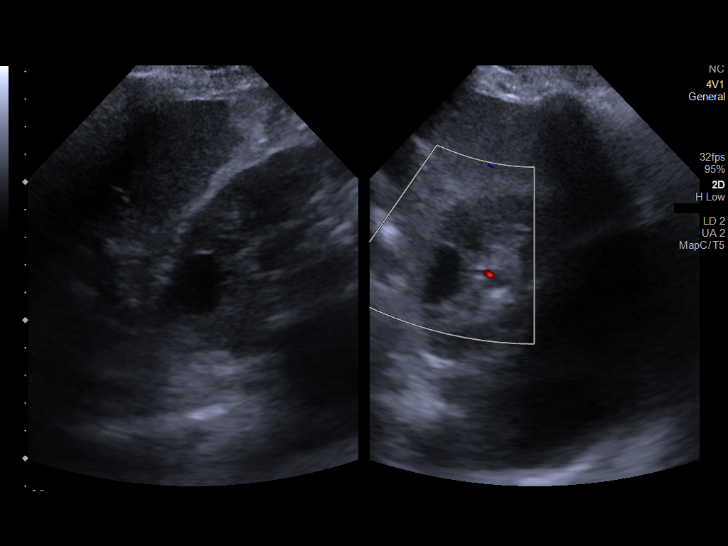
[im 31/41]
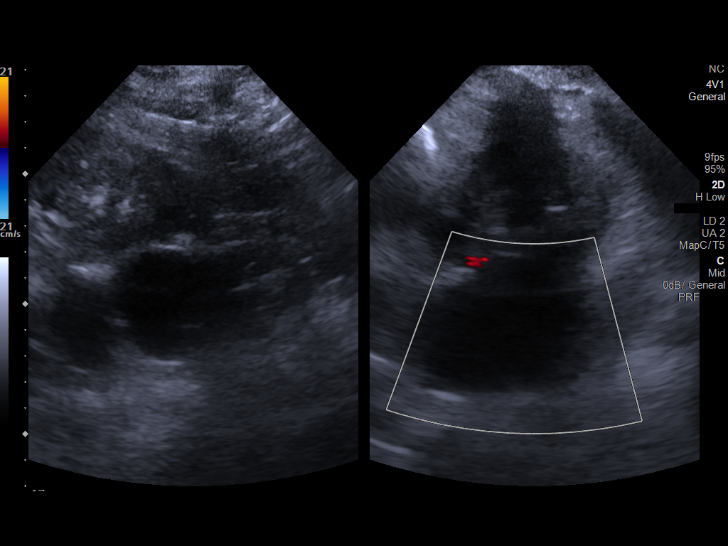
[im 34/41]
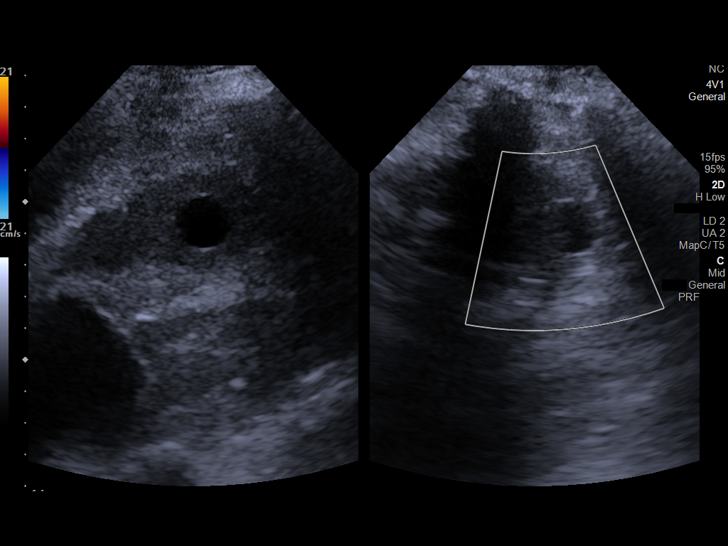
[im 37/41]
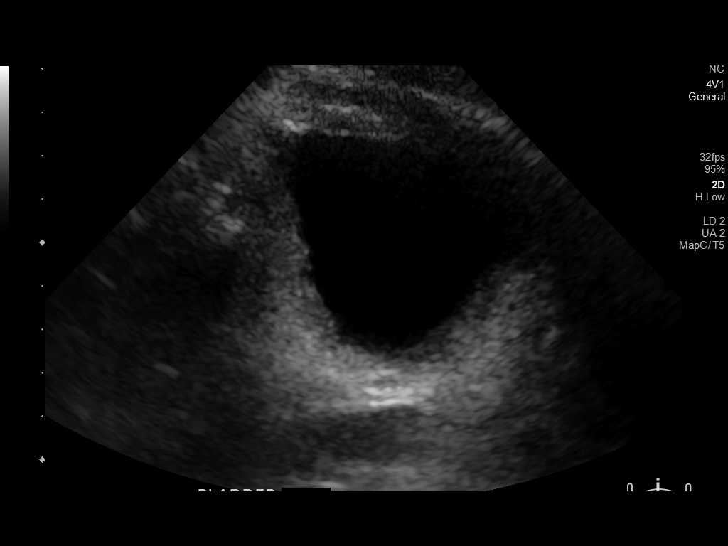
[im 41/41]
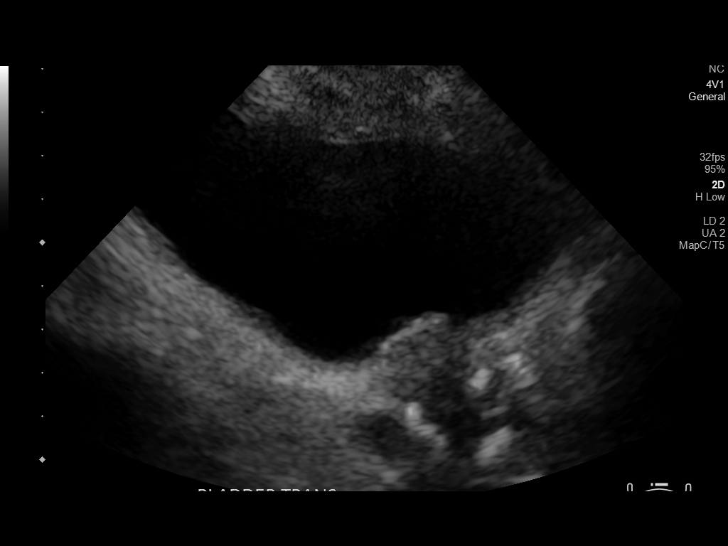

[13 of 25 positions shown; findings below may reference images not displayed]

FINDINGS: Patient in compromised physical and mental state as exam limited due
to patient's inability to follow breathing instructions.

Right Kidney:

Previous right nephrectomy.

Left Kidney:

Renal measurements: 6.2 x 6.5 x 14.3 cm = volume: 304 mL.
Echogenicity within normal limits. No mass or hydronephrosis
visualized. Large cyst over the upper pole measuring 10.7 cm and
second large cyst over the lower pole measuring 8 cm as seen on
recent CT. 1.8 cm cyst over the mid to upper pole. 2.2 cm cyst over
the upper pole. 4.3 cm cyst lower pole. 1.8 cm cyst lower pole. No
solid masses identified.

Bladder:

Foley catheter present within the bladder which is otherwise
unremarkable.
IMPRESSION: Normal size left kidney without hydronephrosis. Multiple left renal
cysts as described. No solid left renal mass visualized. The
hyperdense lesions seen over the mid pole left kidney on CT is not
clearly identified. Consider renal protocol CT or MRI for further
characterization, although if not contrast candidate, then would at
least recommend follow-up noncontrast CT 6 months.

Right nephrectomy.

## 2020-04-15 IMAGING — DX ABDOMEN - 1 VIEW
2 series · 2 of 2 positions shown · non-contrast
Comparison: CT abdomen and pelvis 11/29/2018.

CLINICAL DATA: Abdominal pain.  Blood per rectum.

EXAM:
ABDOMEN - 1 VIEW

[abdomen kub (1 of 2)]
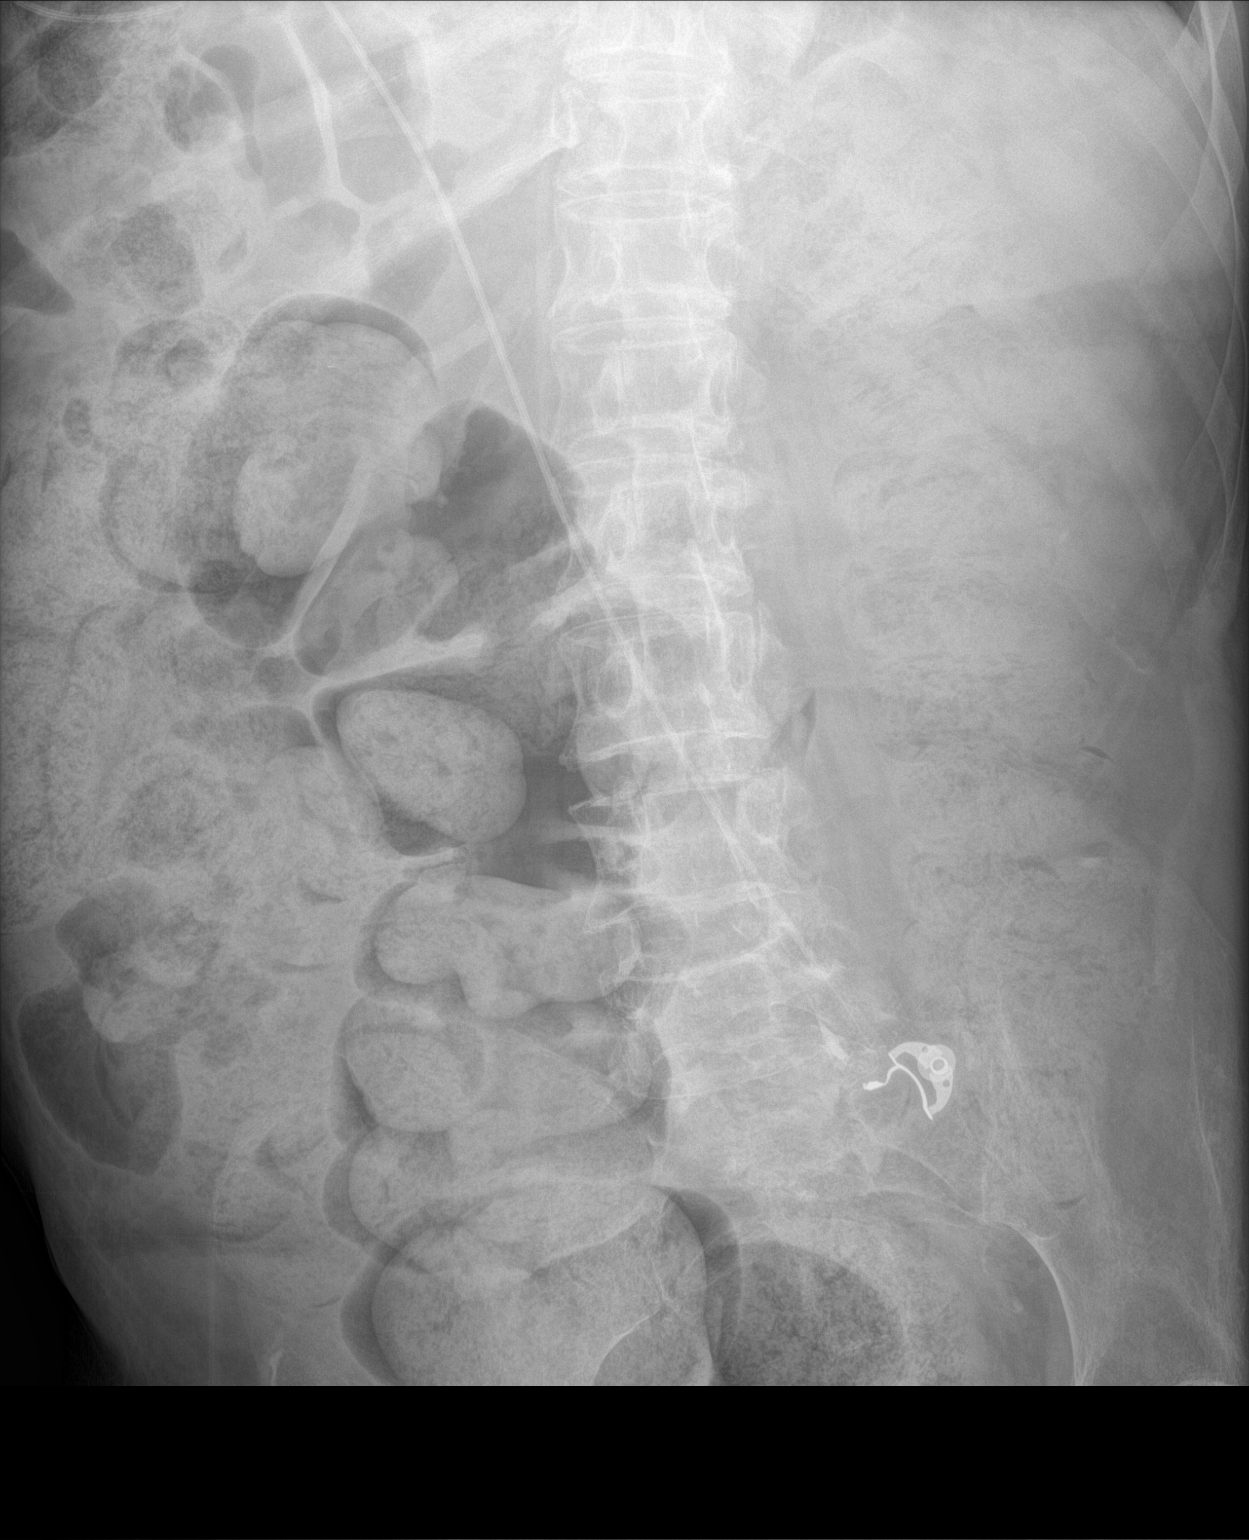

[abdomen kub (2 of 2)]
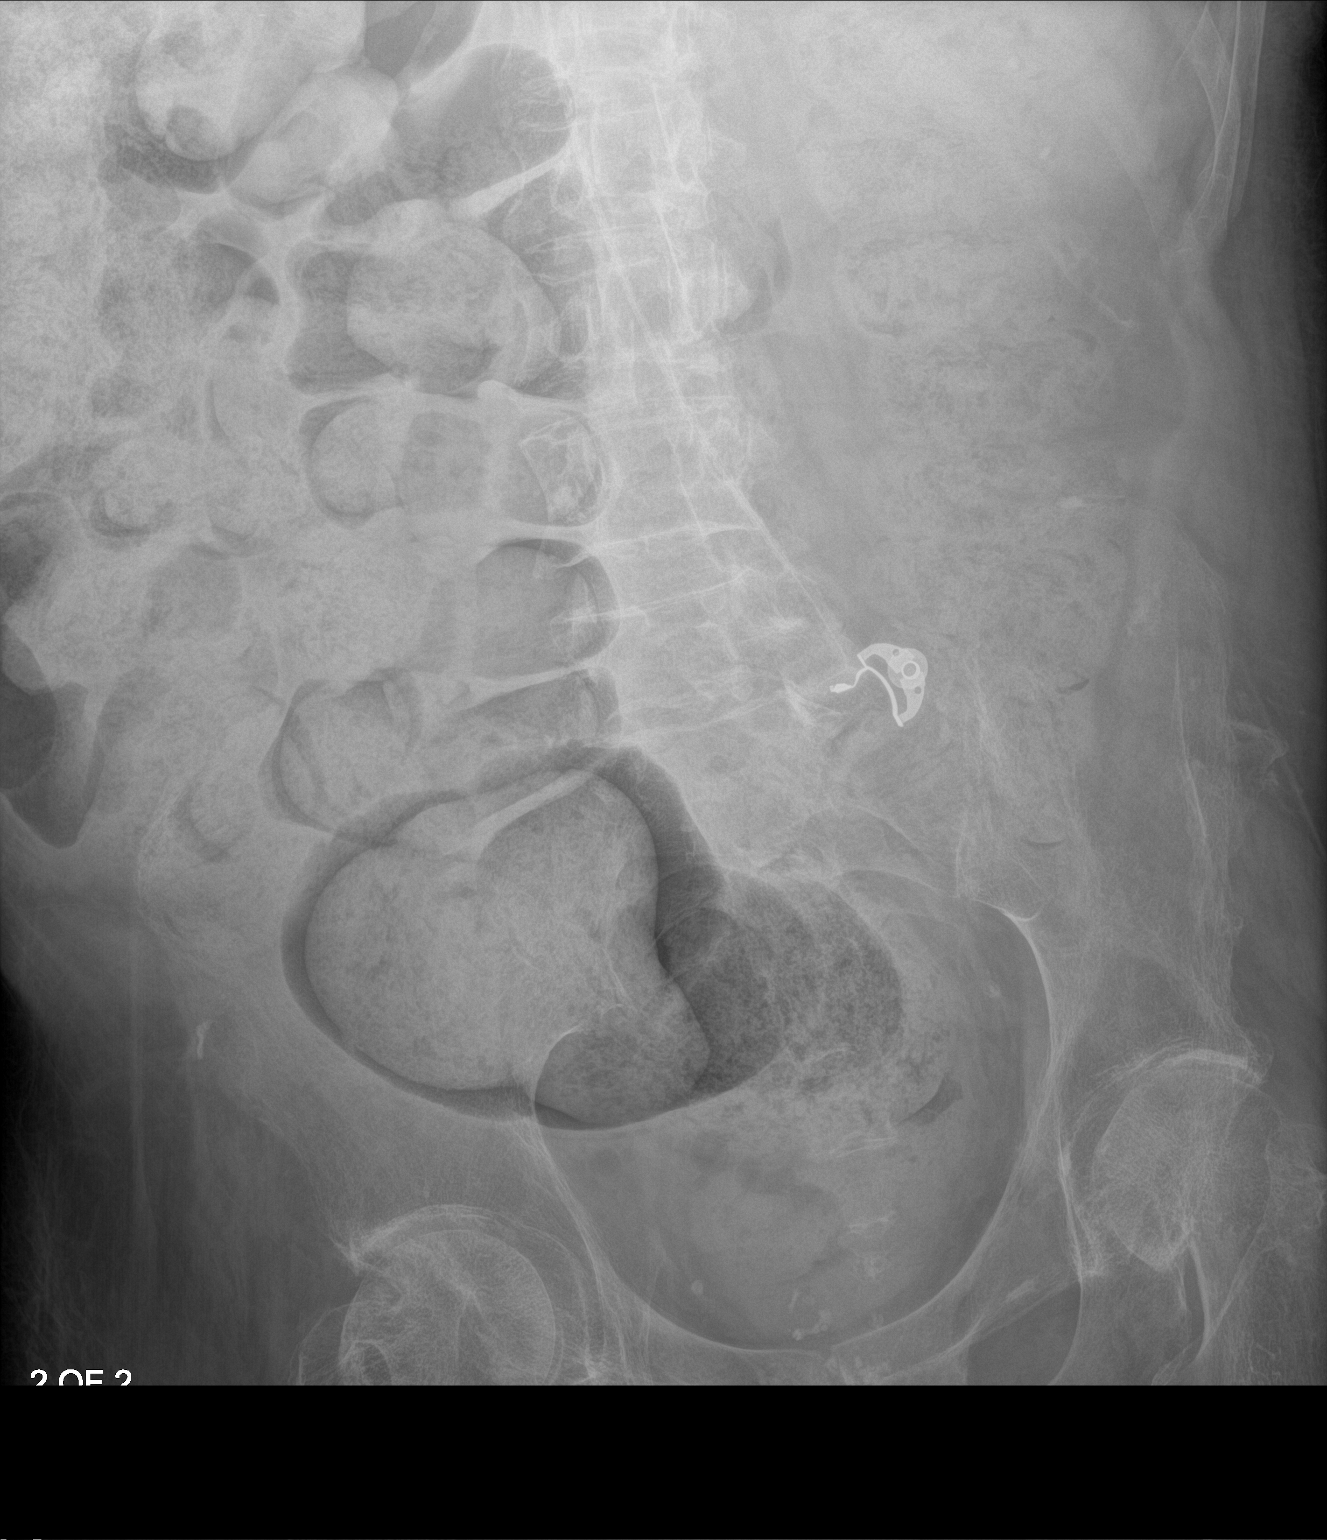

[2 of 2 positions shown; findings below may reference images not displayed]

FINDINGS: Massive volume of stool throughout the colon is unchanged. No
evidence of small bowel obstruction.
IMPRESSION: Severe constipation.
